# Patient Record
Sex: Female | Born: 1980
Health system: Southern US, Community
[De-identification: ages and names within clinical notes are randomized; demographics above are authoritative.]

## PROBLEM LIST (undated history)

## (undated) DIAGNOSIS — E039 Hypothyroidism, unspecified: Secondary | ICD-10-CM

## (undated) DIAGNOSIS — L309 Dermatitis, unspecified: Secondary | ICD-10-CM

## (undated) DIAGNOSIS — N281 Cyst of kidney, acquired: Secondary | ICD-10-CM

## (undated) DIAGNOSIS — J45909 Unspecified asthma, uncomplicated: Secondary | ICD-10-CM

## (undated) DIAGNOSIS — T7840XA Allergy, unspecified, initial encounter: Secondary | ICD-10-CM

## (undated) DIAGNOSIS — K589 Irritable bowel syndrome without diarrhea: Secondary | ICD-10-CM

## (undated) HISTORY — DX: Allergy, unspecified, initial encounter: T78.40XA

## (undated) HISTORY — DX: Cyst of kidney, acquired: N28.1

## (undated) HISTORY — DX: Dermatitis, unspecified: L30.9

## (undated) HISTORY — DX: Unspecified asthma, uncomplicated: J45.909

## (undated) HISTORY — DX: Hypothyroidism, unspecified: E03.9

## (undated) HISTORY — DX: Irritable bowel syndrome without diarrhea: K58.9

---

## 2003-04-20 HISTORY — PX: ROBOTICALLY ASSISTED LAPAROSCOPIC URETERAL RE-IMPLANTATION: SHX6481

## 2014-03-11 ENCOUNTER — Encounter: Payer: Self-pay | Admitting: Family

## 2014-03-11 ENCOUNTER — Ambulatory Visit (INDEPENDENT_AMBULATORY_CARE_PROVIDER_SITE_OTHER): Payer: BC Managed Care – PPO | Admitting: Family

## 2014-03-11 VITALS — BP 116/60 | HR 89 | Temp 98.6°F | Resp 18 | Ht 64.0 in | Wt 108.0 lb

## 2014-03-11 DIAGNOSIS — B351 Tinea unguium: Secondary | ICD-10-CM

## 2014-03-11 DIAGNOSIS — K219 Gastro-esophageal reflux disease without esophagitis: Secondary | ICD-10-CM

## 2014-03-11 DIAGNOSIS — N1370 Vesicoureteral-reflux, unspecified: Secondary | ICD-10-CM

## 2014-03-11 DIAGNOSIS — E039 Hypothyroidism, unspecified: Secondary | ICD-10-CM

## 2014-03-11 DIAGNOSIS — H579 Unspecified disorder of eye and adnexa: Secondary | ICD-10-CM

## 2014-03-11 DIAGNOSIS — J45909 Unspecified asthma, uncomplicated: Secondary | ICD-10-CM | POA: Insufficient documentation

## 2014-03-11 DIAGNOSIS — R938 Abnormal findings on diagnostic imaging of other specified body structures: Secondary | ICD-10-CM

## 2014-03-11 DIAGNOSIS — J4540 Moderate persistent asthma, uncomplicated: Secondary | ICD-10-CM

## 2014-03-11 DIAGNOSIS — K589 Irritable bowel syndrome without diarrhea: Secondary | ICD-10-CM

## 2014-03-11 MED ORDER — NITROFURANTOIN MACROCRYSTAL 50 MG PO CAPS: ORAL_CAPSULE | ORAL | Status: DC

## 2014-03-11 MED ORDER — LEVOTHYROXINE SODIUM 112 MCG PO TABS: 112.0000 ug | ORAL_TABLET | Freq: Every day | ORAL | Status: DC

## 2014-03-11 MED ORDER — FLUCONAZOLE 150 MG PO TABS: 150.0000 mg | ORAL_TABLET | ORAL | Status: DC

## 2014-03-11 MED ORDER — TRETINOIN 0.05 % EX CREA: TOPICAL_CREAM | Freq: Every day | CUTANEOUS | Status: DC

## 2014-03-11 MED ORDER — ALBUTEROL SULFATE HFA 108 (90 BASE) MCG/ACT IN AERS: 2.0000 | INHALATION_SPRAY | Freq: Four times a day (QID) | RESPIRATORY_TRACT | Status: AC | PRN

## 2014-03-11 MED ORDER — FLUTICASONE-SALMETEROL 250-50 MCG/DOSE IN AEPB: 1.0000 | INHALATION_SPRAY | Freq: Two times a day (BID) | RESPIRATORY_TRACT | Status: DC

## 2014-03-11 MED ORDER — LEVONORGEST-ETH ESTRAD 91-DAY 0.15-0.03 MG PO TABS: 1.0000 | ORAL_TABLET | Freq: Every day | ORAL | Status: DC

## 2014-03-11 NOTE — Patient Instructions (Signed)
Please complete lab work prior to leaving. You will be contacted about your kidney ultrasound. Start diflucan for toenails. Schedule complete physical in 3 months.

## 2014-03-11 NOTE — Assessment & Plan Note (Signed)
Stable on advair, and prn albuterol.

## 2014-03-11 NOTE — Progress Notes (Signed)
Subjective:    Patient ID: Tiffany Hamilton, female    DOB: 1981/01/10, 33 y.o.   MRN: [id number]  HPI  Ms. Tiffany Hamilton is a 33 yr old female who presents today to establish care. Moved from [place] in June.  Her husband moved here for job.    Asthma- has had since childhood.  Used to have weekly allergy shots as a child.  Notes allergies improved as she has gotten older.  Has tried flonase but nasonex.  Reports that she uses advair once daily.  Flares up if she is sick or if she stops taking x 1 week. She reports + pet allergies.  Uses otc alcon drops. She has dog (lab hound mix).  If she washes her hands after she pets the dog.  IBS- Diarrhea- has tried multiple meds without improvement. Saw GI in [place].  She tried anaspaz, levsin, and bentyl.   Hypothyroid- has been on current dose x 1 year.  Saw [facility] and was told that she had "bumps" in the inner lid and she advised the following labs:  CBC-Diff, Thyroid panel, TSh, Free t4, Thyroid antibody, T3, ESR, ANA, CMP.   GERD- reports that GI recommended EDG, but she did not complete.  Reports that she takes zantac.   Hx of ureteral reflux- since she was a baby- had surgery 2005 an was told that she had scarring on both kidneys and cyst on the left kidney.  Had a right ureter re-implantation for reflux of the bladder.    Toenail fungus- has tried otc topical rx without improvement.   Review of Systems    see HPI  Past Medical History  Diagnosis Date  . Cyst of left kidney   . Thyroid disease     hypothyroidism  . Asthma   . Allergy   . IBS (irritable bowel syndrome)   . Eczema     History   Social History  . Marital Status: Married    Spouse Name: N/A    Number of Children: N/A  . Years of Education: N/A   Occupational History  . Not on file.   Social History Main Topics  . Smoking status: Former Smoker -- 10 years  . Smokeless tobacco: Never Used     Comment: Previous tobacco use was occasional for 10 years.  .  Alcohol Use: 0.0 oz/week    0 Not specified per week     Comment: 1 beer and 1 glass of wine each night.  . Drug Use: Not on file  . Sexual Activity: Not on file   Other Topics Concern  . 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 Robotically assisted laparoscopic ureteral re-implantation Right 2005    Family History  Problem Relation Age of Onset  . Hypertension Mother   . Hypothyroidism Mother   . Hypothyroidism Father   . Asthma Father   . Cancer Maternal Uncle     pancreas  . Cancer Maternal Grandfather     throat  . Cancer Paternal Grandmother     lung  . Cancer Paternal Grandfather     throat    Allergies not on file  No current outpatient prescriptions on file prior to visit.   No current facility-administered medications on file prior  to visit.    BP 116/60 mmHg  Pulse 89  Temp(Src) 98.6 F (37 C) (Oral)  Resp 18  Ht 5' 4" (1.626 m)  Wt 108 lb (48.988 kg)  BMI 18.53 kg/m2  SpO2 100%  LMP 07/18/2013    Objective:   Physical Exam  Constitutional: She is oriented to person, place, and time. She appears well-developed and well-nourished. No distress.  HENT:  Head: Normocephalic and atraumatic.  Right Ear: Tympanic membrane and ear canal normal.  Left Ear: Tympanic membrane and ear canal normal.  Mouth/Throat: No posterior oropharyngeal edema or posterior oropharyngeal erythema.  Eyes: No scleral icterus.  Neck: No thyromegaly present.  Cardiovascular: Normal rate and regular rhythm.   No murmur heard. Pulmonary/Chest: Effort normal and breath sounds normal. No respiratory distress. She has no wheezes. She has no rales. She exhibits no tenderness.  Musculoskeletal: She exhibits no edema.  Neurological: She is alert and oriented to person, place, and time.  Skin:  Bilateral great  toenails have discoloration  Psychiatric: She has a normal mood and affect. Her behavior is normal. Judgment and thought content normal.          Assessment & Plan:

## 2014-03-11 NOTE — Progress Notes (Signed)
Pre visit review using our clinic review tool, if applicable. No additional management support is needed unless otherwise documented below in the visit note. 

## 2014-03-12 LAB — HEPATIC FUNCTION PANEL
ALT: 11 U/L (ref 0–35)
AST: 21 U/L (ref 0–37)
Albumin: 4 g/dL (ref 3.5–5.2)
Alkaline Phosphatase: 16 U/L — ABNORMAL LOW (ref 39–117)
BILIRUBIN DIRECT: 0.1 mg/dL (ref 0.0–0.3)
BILIRUBIN TOTAL: 0.6 mg/dL (ref 0.2–1.2)
TOTAL PROTEIN: 7.4 g/dL (ref 6.0–8.3)

## 2014-03-12 LAB — TSH: TSH: 3.47 u[IU]/mL (ref 0.35–4.50)

## 2014-03-12 LAB — SEDIMENTATION RATE: SED RATE: 7 mm/h (ref 0–22)

## 2014-03-12 LAB — T3, FREE: T3, Free: 2.7 pg/mL (ref 2.3–4.2)

## 2014-03-13 ENCOUNTER — Other Ambulatory Visit (HOSPITAL_BASED_OUTPATIENT_CLINIC_OR_DEPARTMENT_OTHER): Payer: BC Managed Care – PPO

## 2014-03-13 LAB — ANA: Anti Nuclear Antibody(ANA): NEGATIVE

## 2014-03-13 LAB — THYROID ANTIBODIES
Thyroglobulin Ab: 4 IU/mL — ABNORMAL HIGH (ref <2)
Thyroperoxidase Ab SerPl-aCnc: 201 IU/mL — ABNORMAL HIGH (ref <9)

## 2014-03-16 ENCOUNTER — Encounter: Payer: Self-pay | Admitting: Family

## 2014-03-16 DIAGNOSIS — N1370 Vesicoureteral-reflux, unspecified: Secondary | ICD-10-CM | POA: Insufficient documentation

## 2014-03-16 DIAGNOSIS — B351 Tinea unguium: Secondary | ICD-10-CM | POA: Insufficient documentation

## 2014-03-16 DIAGNOSIS — E039 Hypothyroidism, unspecified: Secondary | ICD-10-CM | POA: Insufficient documentation

## 2014-03-16 DIAGNOSIS — K219 Gastro-esophageal reflux disease without esophagitis: Secondary | ICD-10-CM | POA: Insufficient documentation

## 2014-03-16 DIAGNOSIS — K589 Irritable bowel syndrome without diarrhea: Secondary | ICD-10-CM | POA: Insufficient documentation

## 2014-03-16 NOTE — Assessment & Plan Note (Signed)
Will obtain bmet to assess renal function as well as a renal ultrasound to further evaluate.  If abnormal, plan referral to urology.

## 2014-03-16 NOTE — Assessment & Plan Note (Signed)
Will rx with lamisil.

## 2014-03-16 NOTE — Assessment & Plan Note (Signed)
Clinically stable on zantac.

## 2014-03-16 NOTE — Assessment & Plan Note (Signed)
Stable on synthroid, + thyroid Antibodies but not surprising given hx of hypothyroid.

## 2014-03-16 NOTE — Assessment & Plan Note (Signed)
Fair control. We discussed referral to local GI. She declines at this time.

## 2014-03-19 ENCOUNTER — Encounter: Payer: Self-pay | Admitting: Family

## 2014-03-19 ENCOUNTER — Telehealth: Payer: Self-pay | Admitting: *Deleted

## 2014-03-19 ENCOUNTER — Telehealth: Payer: Self-pay | Admitting: Family

## 2014-03-19 DIAGNOSIS — Z0000 Encounter for general adult medical examination without abnormal findings: Secondary | ICD-10-CM

## 2014-03-19 DIAGNOSIS — R61 Generalized hyperhidrosis: Secondary | ICD-10-CM

## 2014-03-19 NOTE — Telephone Encounter (Signed)
Pt stated she would continue to use my chart and send Melissa a message

## 2014-03-19 NOTE — Telephone Encounter (Signed)
Lab is unable to add below tests.  Pt's next f/u is 06/17/14. Do we need to bring pt back before that time to obtain below labs?

## 2014-03-19 NOTE — Telephone Encounter (Signed)
-----   Message from Sandford CrazeMelissa O'Sullivan, NP sent at 03/16/2014 10:12 PM EST ----- BMET and CBC were not drawn- could you please see if lab can add on? thanks

## 2014-03-19 NOTE — Telephone Encounter (Signed)
Attempted to reach pt and left message to return my call or send another mychart message.

## 2014-03-19 NOTE — Telephone Encounter (Signed)
Caller name:Trebilcock, Lyle Relation to ZO:XWRUpt:self Call back number:[phone] Pharmacy:  Reason for call: pt states she has been communicating with Melissa through my chart and states she has a question regarding her labs.

## 2014-03-20 NOTE — Telephone Encounter (Signed)
Lets request that she return sooner to complete please.

## 2014-03-20 NOTE — Telephone Encounter (Signed)
See previous phone note of 03/19/14.

## 2014-03-20 NOTE — Telephone Encounter (Signed)
Spoke with pt. R/S cpx for 04/03/14 at 8am. Future lab orders entered 03/20/14 and will also need to complete cbc and bmet from 03/11/14 as well. Gave copy of phone note to Angie in the lab.

## 2014-03-20 NOTE — Telephone Encounter (Signed)
Spoke with pt.  She reports + night sweats.  Will have her return to lab for hiv screening and TB screening.  Please contact pt to arrange lab visit. She would like to schedule a cpx as well.

## 2014-03-30 ENCOUNTER — Ambulatory Visit (HOSPITAL_BASED_OUTPATIENT_CLINIC_OR_DEPARTMENT_OTHER)
Admission: RE | Admit: 2014-03-30 | Discharge: 2014-03-30 | Disposition: A | Discharge status: Home / Self Care | Payer: BC Managed Care – PPO | Source: Ambulatory Visit | Attending: Family | Admitting: Family

## 2014-03-30 DIAGNOSIS — N1330 Unspecified hydronephrosis: Secondary | ICD-10-CM | POA: Insufficient documentation

## 2014-03-30 DIAGNOSIS — N1370 Vesicoureteral-reflux, unspecified: Secondary | ICD-10-CM

## 2014-03-30 DIAGNOSIS — Q625 Duplication of ureter: Secondary | ICD-10-CM | POA: Insufficient documentation

## 2014-03-31 ENCOUNTER — Encounter: Payer: Self-pay | Admitting: Family

## 2014-03-31 DIAGNOSIS — N281 Cyst of kidney, acquired: Secondary | ICD-10-CM | POA: Insufficient documentation

## 2014-03-31 DIAGNOSIS — Q625 Duplication of ureter: Secondary | ICD-10-CM | POA: Insufficient documentation

## 2014-04-01 ENCOUNTER — Telehealth: Payer: Self-pay | Admitting: *Deleted

## 2014-04-01 DIAGNOSIS — N281 Cyst of kidney, acquired: Secondary | ICD-10-CM

## 2014-04-01 NOTE — Telephone Encounter (Signed)
Left detailed message on cell# re: below result and to call to confirm f/u u/s and if any questions.  Notes Recorded by Sandford CrazeMelissa O'Sullivan, NP on 03/31/2014 at 12:44 PM Please let pt know that US looks stable. Note is made of renal cyst. Lets plan to repeat US in 6 months.

## 2014-04-03 ENCOUNTER — Ambulatory Visit (INDEPENDENT_AMBULATORY_CARE_PROVIDER_SITE_OTHER): Payer: BC Managed Care – PPO | Admitting: Family

## 2014-04-03 ENCOUNTER — Encounter: Payer: Self-pay | Admitting: Family

## 2014-04-03 ENCOUNTER — Other Ambulatory Visit (HOSPITAL_COMMUNITY)
Admission: RE | Admit: 2014-04-03 | Discharge: 2014-04-03 | Disposition: A | Discharge status: Home / Self Care | Payer: BC Managed Care – PPO | Source: Ambulatory Visit | Attending: Family | Admitting: Family

## 2014-04-03 VITALS — BP 96/68 | HR 80 | Temp 99.0°F | Resp 16 | Ht 64.0 in | Wt 107.2 lb

## 2014-04-03 DIAGNOSIS — N760 Acute vaginitis: Secondary | ICD-10-CM | POA: Diagnosis present

## 2014-04-03 DIAGNOSIS — Z113 Encounter for screening for infections with a predominantly sexual mode of transmission: Secondary | ICD-10-CM | POA: Insufficient documentation

## 2014-04-03 DIAGNOSIS — R61 Generalized hyperhidrosis: Secondary | ICD-10-CM

## 2014-04-03 DIAGNOSIS — Z23 Encounter for immunization: Secondary | ICD-10-CM

## 2014-04-03 DIAGNOSIS — Z0000 Encounter for general adult medical examination without abnormal findings: Secondary | ICD-10-CM

## 2014-04-03 DIAGNOSIS — Z01419 Encounter for gynecological examination (general) (routine) without abnormal findings: Secondary | ICD-10-CM

## 2014-04-03 LAB — LIPID PANEL
CHOL/HDL RATIO: 3
Cholesterol: 180 mg/dL (ref 0–200)
HDL: 63.4 mg/dL (ref >39.00)
LDL Cholesterol: 97 mg/dL (ref 0–99)
NonHDL: 116.6
TRIGLYCERIDES: 99 mg/dL (ref 0.0–149.0)
VLDL: 19.8 mg/dL (ref 0.0–40.0)

## 2014-04-03 LAB — BASIC METABOLIC PANEL
BUN: 9 mg/dL (ref 6–23)
CO2: 25 mEq/L (ref 19–32)
Calcium: 9.2 mg/dL (ref 8.4–10.5)
Chloride: 106 mEq/L (ref 96–112)
Creatinine, Ser: 0.8 mg/dL (ref 0.4–1.2)
GFR: 85.56 mL/min (ref >60.00)
Glucose, Bld: 61 mg/dL — ABNORMAL LOW (ref 70–99)
Potassium: 4.1 mEq/L (ref 3.5–5.1)
SODIUM: 135 meq/L (ref 135–145)

## 2014-04-03 LAB — CBC WITH DIFFERENTIAL/PLATELET
BASOS ABS: 0 10*3/uL (ref 0.0–0.1)
BASOS PCT: 0.6 % (ref 0.0–3.0)
Eosinophils Absolute: 0.2 10*3/uL (ref 0.0–0.7)
Eosinophils Relative: 3.8 % (ref 0.0–5.0)
HCT: 43.8 % (ref 36.0–46.0)
HEMOGLOBIN: 14.4 g/dL (ref 12.0–15.0)
LYMPHS PCT: 26.4 % (ref 12.0–46.0)
Lymphs Abs: 1.6 10*3/uL (ref 0.7–4.0)
MCHC: 32.8 g/dL (ref 30.0–36.0)
MCV: 93.3 fl (ref 78.0–100.0)
Monocytes Absolute: 0.4 10*3/uL (ref 0.1–1.0)
Monocytes Relative: 6.7 % (ref 3.0–12.0)
NEUTROS ABS: 3.7 10*3/uL (ref 1.4–7.7)
Neutrophils Relative %: 62.5 % (ref 43.0–77.0)
Platelets: 267 10*3/uL (ref 150.0–400.0)
RBC: 4.69 Mil/uL (ref 3.87–5.11)
RDW: 13 % (ref 11.5–15.5)
WBC: 5.9 10*3/uL (ref 4.0–10.5)

## 2014-04-03 NOTE — Assessment & Plan Note (Signed)
Continue healthy diet, exercise. Tdap today, complete fasting lab work today.  Will add HIV, TB gold due to complaint of night sweats.  GC/Chlamydia and wet prep performed today at pt's request.

## 2014-04-03 NOTE — Progress Notes (Signed)
Pre visit review using our clinic review tool, if applicable. No additional management support is needed unless otherwise documented below in the visit note. 

## 2014-04-03 NOTE — Addendum Note (Signed)
Addended by: Eustace QuailEABOLD, Kadee Philyaw J on: 04/03/2014 04:07 PM   Modules accepted: Orders

## 2014-04-03 NOTE — Addendum Note (Signed)
Addended by: Arnette NorrisPAYNE, Hoover Grewe P on: 04/03/2014 10:32 AM   Modules accepted: Orders

## 2014-04-03 NOTE — Patient Instructions (Addendum)
Please complete lab work prior to leaving. We will contact you with your results.  Follow up in 6 months.

## 2014-04-03 NOTE — Telephone Encounter (Signed)
Melissa--do you want to enter future u/s order?

## 2014-04-03 NOTE — Progress Notes (Signed)
Subjective:    Patient ID: Tiffany Hamilton, female    DOB: [birth year], 33 y.o.   MRN: [id number]  HPI  Ms. Tiffany Hamilton is a 33 yr old female who presents today for cpx. Patient presents today for complete physical.  Immunizations: believes she is past due for tetanus, flu shot is normal. Diet:  Healthy for the most part Exercise: walks frequently Pap Smear:5/15- per gyn- pt reports normal.   Reports vaginal dryness.  Dental: up to date Eye:  Up to date  Review of Systems  Constitutional: Negative for unexpected weight change.  HENT: Negative for hearing loss and rhinorrhea.   Eyes: Negative for visual disturbance.  Respiratory: Negative for cough and shortness of breath.   Cardiovascular: Negative for chest pain.  Gastrointestinal: Negative for nausea.       Gerd symptoms with stress,takes zantac as needed. Diarrhea IBS  Genitourinary: Negative for menstrual problem.  Musculoskeletal: Negative for myalgias and arthralgias.  Skin:       Eczema on hands in the winter- worse with stress  Neurological:       Occasional headaches  Hematological: Negative for adenopathy.  Psychiatric/Behavioral:       Denies anxiety/depression  GYN: reports + vaginal dryness. Also reports some night sweats.      Past Medical History  Diagnosis Date  . Cyst of left kidney   . Thyroid disease     hypothyroidism  . Asthma   . Allergy   . IBS (irritable bowel syndrome)   . Eczema     History   Social History  . Marital Status: Married    Spouse Name: N/A    Number of Children: N/A  . Years of Education: N/A   Occupational History  . Not on file.   Social History Main Topics  . Smoking status: Former Smoker -- 10 years  . Smokeless tobacco: Never Used     Comment: Previous tobacco use was occasional for 10 years.  . Alcohol Use: 0.0 oz/week    0 Not specified per week     Comment: 1 beer and 1 glass of wine each night.  . Drug Use: Not on file  . Sexual Activity: Not on file    Other Topics Concern  . Not on file   Social History Narrative   Engineering geologistenior admin assistant   Bachelors degree   3 step children   1 dog   1 cat   Enjoys shopping, cleaning      Past Surgical History  Procedure Laterality Date  . Robotically assisted laparoscopic ureteral re-implantation Right 2005    Family History  Problem Relation Age of Onset  . Hypertension Mother   . Hypothyroidism Mother   . Hypothyroidism Father   . Asthma Father   . Cancer Maternal Uncle     pancreas  . Cancer Maternal Grandfather     throat  . Cancer Paternal Grandmother     lung  . Cancer Paternal Grandfather     throat    Allergies  Allergen Reactions  . Morphine And Related Hives  . Penicillins Hives  . Septra [Sulfamethoxazole-Trimethoprim] Hives    Current Outpatient Prescriptions on File Prior to Visit  Medication Sig Dispense Refill  . albuterol (PROVENTIL HFA;VENTOLIN HFA) 108 (90 BASE) MCG/ACT inhaler Inhale 2 puffs into the lungs every 6 (six) hours as needed for wheezing or shortness of breath. 1 Inhaler 2  . fluconazole (DIFLUCAN) 150 MG tablet Take 1 tablet (150 mg total) by mouth once  a week. 12 tablet 0  . Fluticasone-Salmeterol (ADVAIR DISKUS) 250-50 MCG/DOSE AEPB Inhale 1 puff into the lungs 2 (two) times daily. 1 each 3  . levonorgestrel-ethinyl estradiol (SEASONALE) 0.15-0.03 MG tablet Take 1 tablet by mouth daily. 1 Package 4  . levothyroxine (SYNTHROID, LEVOTHROID) 112 MCG tablet Take 1 tablet (112 mcg total) by mouth daily. 90 tablet 3  . nitrofurantoin (MACRODANTIN) 50 MG capsule One tab by mouth after intercourse 30 capsule 2  . ranitidine (ZANTAC) 150 MG tablet Take 150 mg by mouth at bedtime.    . tretinoin (RETIN-A) 0.05 % cream Apply topically at bedtime. 45 g 0   No current facility-administered medications on file prior to visit.    BP 96/68 mmHg  Pulse 80  Temp(Src) 99 F (37.2 C) (Oral)  Resp 16  Ht 5\' 4"  (1.626 m)  Wt 107 lb 3.2 oz (48.626 kg)   BMI 18.39 kg/m2  SpO2 99%  LMP 07/18/2013    Objective:   Physical Exam  Physical Exam  Constitutional: She is oriented to person, place, and time. She appears well-developed and well-nourished. No distress.  HENT:  Head: Normocephalic and atraumatic.  Right Ear: Tympanic membrane and ear canal normal.  Left Ear: Tympanic membrane and ear canal normal.  Mouth/Throat: Oropharynx is clear and moist.  Eyes: Pupils are equal, round, and reactive to light. No scleral icterus.  Neck: Normal range of motion. No thyromegaly present.  Cardiovascular: Normal rate and regular rhythm.   No murmur heard. Pulmonary/Chest: Effort normal and breath sounds normal. No respiratory distress. He has no wheezes. She has no rales. She exhibits no tenderness.  Abdominal: Soft. Bowel sounds are normal. He exhibits no distension and no mass. There is no tenderness. There is no rebound and no guarding.  Musculoskeletal: She exhibits no edema.  Lymphadenopathy:    She has no cervical adenopathy.  Neurological: She is alert and oriented to person, place, and time. She has 3+ bilateral patellar reflexes. She exhibits normal muscle tone. Coordination normal.  Skin: Skin is warm and dry.  Psychiatric: She has a normal mood and affect. Her behavior is normal. Judgment and thought content normal.  Breasts: Examined lying Right: Without masses, retractions, discharge or axillary adenopathy.  Left: Without masses, retractions, discharge or axillary adenopathy.  Inguinal/mons: Normal without inguinal adenopathy  External genitalia: Normal  BUS/Urethra/Skene's glands: Normal  Bladder: Normal  Vagina: Normal  Cervix: Normal  Anus and perineum: Normal           Assessment & Plan:         Assessment & Plan:

## 2014-04-03 NOTE — Telephone Encounter (Signed)
Yes please

## 2014-04-03 NOTE — Addendum Note (Signed)
Addended by: Mervin KungFERGERSON, Ryne Mctigue A on: 04/03/2014 09:14 AM   Modules accepted: Orders

## 2014-04-03 NOTE — Telephone Encounter (Signed)
Order placed

## 2014-04-04 ENCOUNTER — Encounter: Payer: Self-pay | Admitting: Family

## 2014-04-04 LAB — HIV ANTIBODY (ROUTINE TESTING W REFLEX): HIV: NONREACTIVE

## 2014-04-04 LAB — CERVICOVAGINAL ANCILLARY ONLY
Chlamydia: NEGATIVE
Neisseria Gonorrhea: NEGATIVE
Wet Prep (BD Affirm): NEGATIVE
Wet Prep (BD Affirm): NEGATIVE
Wet Prep (BD Affirm): NEGATIVE

## 2014-04-04 NOTE — Addendum Note (Signed)
Addended by: Silvio PateHOMPSON, Milan Perkins D on: 04/04/2014 05:04 PM   Modules accepted: Orders

## 2014-04-05 ENCOUNTER — Telehealth: Payer: Self-pay | Admitting: *Deleted

## 2014-04-05 LAB — QUANTIFERON TB GOLD ASSAY (BLOOD)
INTERFERON GAMMA RELEASE ASSAY: NEGATIVE
Mitogen value: 9.07 IU/mL
QUANTIFERON TB AG MINUS NIL: 0 [IU]/mL
Quantiferon Nil Value: 0.03 IU/mL
TB AG VALUE: 0.03 [IU]/mL

## 2014-04-05 LAB — MICROALBUMIN / CREATININE URINE RATIO
Creatinine,U: 99.1 mg/dL
MICROALB/CREAT RATIO: 1.1 mg/g (ref 0.0–30.0)
Microalb, Ur: 1.1 mg/dL (ref 0.0–1.9)

## 2014-04-05 MED ORDER — LEVONORGEST-ETH ESTRAD 91-DAY 0.15-0.03 MG PO TABS: 1.0000 | ORAL_TABLET | Freq: Every day | ORAL | Status: DC

## 2014-04-05 MED ORDER — LEVOTHYROXINE SODIUM 112 MCG PO TABS: 112.0000 ug | ORAL_TABLET | Freq: Every day | ORAL | Status: DC

## 2014-04-05 NOTE — Telephone Encounter (Signed)
Received fax request from [facility] for levothyroxine and L-norges/eth es(jolessa).  Refills sent.

## 2014-04-23 ENCOUNTER — Telehealth: Payer: Self-pay | Admitting: *Deleted

## 2014-04-23 NOTE — Telephone Encounter (Signed)
Received fax from [facility] stating Advair is not covered by insurance. Preferred alternatives are:  Dulera, symbicort, pulmicort and QVAR. Please advise if one of these would be appropriate?

## 2014-04-24 MED ORDER — BUDESONIDE-FORMOTEROL FUMARATE 80-4.5 MCG/ACT IN AERO: 2.0000 | INHALATION_SPRAY | Freq: Two times a day (BID) | RESPIRATORY_TRACT | Status: DC

## 2014-04-24 NOTE — Telephone Encounter (Signed)
D/c advair, start symbicort (pended below).  Please notify pt.

## 2014-04-24 NOTE — Telephone Encounter (Signed)
Notified pt and she voices understanding. Symbicort rx sent to [facility].

## 2014-05-17 ENCOUNTER — Encounter: Payer: Self-pay | Admitting: Family

## 2014-05-17 MED ORDER — MOMETASONE FUROATE 50 MCG/ACT NA SUSP: 2.0000 | Freq: Every day | NASAL | Status: DC

## 2014-05-17 NOTE — Telephone Encounter (Signed)
Please let pt know nasonex is otc. If she prefers that we send rx to pharmacy ok to send 3 month supply.

## 2014-05-27 ENCOUNTER — Telehealth: Payer: Self-pay | Admitting: Family

## 2014-05-27 MED ORDER — FLUNISOLIDE 25 MCG/ACT (0.025%) NA SOLN: 2.0000 | Freq: Every day | NASAL | Status: DC

## 2014-05-27 NOTE — Telephone Encounter (Signed)
[facility], nasonex non-formulary, preferred alternative is flunisolide ns spray 0.025%, new Rx faxed for preferred alternative.

## 2014-05-28 ENCOUNTER — Encounter: Payer: Self-pay | Admitting: Family

## 2014-05-28 MED ORDER — NITROFURANTOIN MACROCRYSTAL 50 MG PO CAPS: ORAL_CAPSULE | ORAL | Status: DC

## 2014-06-06 ENCOUNTER — Other Ambulatory Visit: Payer: Self-pay | Admitting: Family

## 2014-06-06 DIAGNOSIS — Z5181 Encounter for therapeutic drug level monitoring: Secondary | ICD-10-CM

## 2014-06-07 MED ORDER — FLUCONAZOLE 150 MG PO TABS: 150.0000 mg | ORAL_TABLET | ORAL | Status: DC

## 2014-06-07 NOTE — Addendum Note (Signed)
Addended by: Sandford Craze'SULLIVAN, Samika Vetsch on: 06/07/2014 08:31 AM   Modules accepted: Orders

## 2014-06-10 ENCOUNTER — Telehealth: Payer: Self-pay | Admitting: Family

## 2014-06-10 DIAGNOSIS — Z5181 Encounter for therapeutic drug level monitoring: Secondary | ICD-10-CM

## 2014-06-10 NOTE — Telephone Encounter (Signed)
Pt decided to go with [facility] as she has been there before and is familiar with that area. Future order placed. See phone note.

## 2014-06-10 NOTE — Telephone Encounter (Signed)
When spoke with PT earlier I mentioned the [facility] and she was ok with that option.

## 2014-06-10 NOTE — Telephone Encounter (Signed)
Left message for pt to return my call. [facility] or is there another lab that she prefers?

## 2014-06-10 NOTE — Telephone Encounter (Signed)
Spoke with pt and confirmed [facility]. Future order placed.

## 2014-06-10 NOTE — Telephone Encounter (Signed)
Caller name:Gailene Heavin Relationship to patient:self Can be [phone]   Reason for call: PT requesting [place] functions- Please advise.

## 2014-06-12 ENCOUNTER — Other Ambulatory Visit (INDEPENDENT_AMBULATORY_CARE_PROVIDER_SITE_OTHER): Payer: Self-pay

## 2014-06-12 DIAGNOSIS — Z5181 Encounter for therapeutic drug level monitoring: Secondary | ICD-10-CM

## 2014-06-12 LAB — HEPATIC FUNCTION PANEL
ALK PHOS: 16 U/L — AB (ref 39–117)
ALT: 12 U/L (ref 0–35)
AST: 15 U/L (ref 0–37)
Albumin: 3.9 g/dL (ref 3.5–5.2)
BILIRUBIN DIRECT: 0.1 mg/dL (ref 0.0–0.3)
TOTAL PROTEIN: 7 g/dL (ref 6.0–8.3)
Total Bilirubin: 0.7 mg/dL (ref 0.2–1.2)

## 2014-06-17 ENCOUNTER — Encounter: Payer: BC Managed Care – PPO | Admitting: Family

## 2014-07-02 ENCOUNTER — Other Ambulatory Visit: Payer: Self-pay | Admitting: Family

## 2014-07-03 NOTE — Telephone Encounter (Signed)
We were treating her for her toenails.  We should see her back in the office before we extend treatment.

## 2014-07-03 NOTE — Telephone Encounter (Signed)
Notified pt. She states she completed liver function testing 06/12/14 and wants to know if it is still necessary to schedule office visit?  Last rx we gave was 05/28/14, #6 but pt states pharmacy only gave her #4 tablets.  Please advise.

## 2014-07-05 NOTE — Telephone Encounter (Signed)
I sent a 12 week supply on 11/23, generally we only treat for 12 weeks so I would like to see her back in the office to re-evaluated her toenails. Alternatively, I can arrange derm consult instead if she prefers. Would hold off on refills.

## 2014-07-05 NOTE — Telephone Encounter (Signed)
Left detailed message on voicemail re: below information and for pt to call us back and let us know how she wants to proceed.

## 2014-07-08 ENCOUNTER — Telehealth: Payer: Self-pay | Admitting: *Deleted

## 2014-07-08 MED ORDER — NITROFURANTOIN MACROCRYSTAL 50 MG PO CAPS: ORAL_CAPSULE | ORAL | Status: DC

## 2014-07-08 NOTE — Telephone Encounter (Signed)
Received refill request from [facility] for nitrofurantoin. Rx sent.

## 2014-08-18 ENCOUNTER — Other Ambulatory Visit: Payer: Self-pay | Admitting: Family

## 2014-08-25 ENCOUNTER — Telehealth: Payer: Self-pay | Admitting: Family

## 2014-08-25 NOTE — Telephone Encounter (Signed)
-----   Message from Eugene Garnetarolyn S Chandler sent at 08/22/2014 10:30 AM EDT ----- Regarding: renal us Daphine DeutscherHey Jenn:  I called [facility] to schedule renal ultrasound and she stated she did not want to have this renal ultrasound for another 5 years.  Said that the cyst had not changed in 10 years when she had the last one in December.  Thanks, Eber Jonesarolyn

## 2014-09-23 ENCOUNTER — Encounter: Payer: Self-pay | Admitting: Family

## 2014-09-24 NOTE — Telephone Encounter (Signed)
Notified pt and she voices understanding. May be able to bring pt in tomorrow at 8:45am. Advised pt I will confirm with her this afternoon via mychart.

## 2014-09-24 NOTE — Telephone Encounter (Signed)
I would like to evaluate in office. Sometimes these infections are bacterial not yeast, so I like to check a vaginal swab.

## 2014-09-24 NOTE — Telephone Encounter (Signed)
Spoke with pt and scheduled appt for tomorrow at 10am.

## 2014-09-25 ENCOUNTER — Ambulatory Visit (INDEPENDENT_AMBULATORY_CARE_PROVIDER_SITE_OTHER): Payer: BLUE CROSS/BLUE SHIELD | Admitting: Family

## 2014-09-25 ENCOUNTER — Other Ambulatory Visit (HOSPITAL_COMMUNITY)
Admission: RE | Admit: 2014-09-25 | Discharge: 2014-09-25 | Disposition: A | Discharge status: Home / Self Care | Payer: BLUE CROSS/BLUE SHIELD | Source: Ambulatory Visit | Attending: Family | Admitting: Family

## 2014-09-25 ENCOUNTER — Encounter: Payer: Self-pay | Admitting: Family

## 2014-09-25 ENCOUNTER — Telehealth: Payer: Self-pay | Admitting: *Deleted

## 2014-09-25 VITALS — BP 98/68 | HR 75 | Temp 98.2°F | Resp 16 | Ht 64.0 in | Wt 108.8 lb

## 2014-09-25 DIAGNOSIS — N760 Acute vaginitis: Secondary | ICD-10-CM | POA: Insufficient documentation

## 2014-09-25 DIAGNOSIS — E039 Hypothyroidism, unspecified: Secondary | ICD-10-CM | POA: Diagnosis not present

## 2014-09-25 DIAGNOSIS — J309 Allergic rhinitis, unspecified: Secondary | ICD-10-CM

## 2014-09-25 DIAGNOSIS — B351 Tinea unguium: Secondary | ICD-10-CM

## 2014-09-25 LAB — TSH: TSH: 1.07 u[IU]/mL (ref 0.35–4.50)

## 2014-09-25 MED ORDER — FLUTICASONE PROPIONATE 50 MCG/ACT NA SUSP: 2.0000 | Freq: Every day | NASAL | Status: DC

## 2014-09-25 MED ORDER — FLUCONAZOLE 150 MG PO TABS: ORAL_TABLET | ORAL | Status: DC

## 2014-09-25 NOTE — Assessment & Plan Note (Addendum)
clinically stable on current dose of synthroid. Continue same, obtain bmet.

## 2014-09-25 NOTE — Progress Notes (Signed)
Pre visit review using our clinic review tool, if applicable. No additional management support is needed unless otherwise documented below in the visit note. 

## 2014-09-25 NOTE — Progress Notes (Signed)
Subjective:    Patient ID: Tiffany Hamilton, female    DOB: [birth year], 34 y.o.   MRN: [id number]  HPI  Tayonna presents today with chief complaint of vaginal itching. Pt reports that she tried otc monistat 1 last week without improvement in her symptoms.  Hypothyroid- Reports feeling well on current dose of synthroid.  Lab Results  Component Value Date   TSH 3.47 03/11/2014   Allergic rhinitis- Reports nasalide causes her nose to burn.  Would like to restart flonase instead.   Review of Systems    see HPI  Past Medical History  Diagnosis Date  . Cyst of left kidney   . Thyroid disease     hypothyroidism  . Asthma   . Allergy   . IBS (irritable bowel syndrome)   . Eczema     History   Social History  . Marital Status: Married    Spouse Name: N/A  . Number of Children: N/A  . Years of Education: N/A   Occupational History  . Not on file.   Social History Main Topics  . Smoking status: Former Smoker -- 10 years  . Smokeless tobacco: Never Used     Comment: Previous tobacco use was occasional for 10 years.  . Alcohol Use: 0.0 oz/week    0 Standard drinks or equivalent per week     Comment: 1 beer and 1 glass of wine each night.  . Drug Use: Not on file  . Sexual Activity: Not on file   Other Topics Concern  . 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 Robotically assisted laparoscopic ureteral re-implantation Right 2005    Family History  Problem Relation Age of Onset  . Hypertension Mother   . Hypothyroidism Mother   . Hypothyroidism Father   . Asthma Father   . Cancer Maternal Uncle     pancreas  . Cancer Maternal Grandfather     throat  . Cancer Paternal Grandmother     lung  . Cancer Paternal Grandfather     throat    Allergies  Allergen Reactions  . Morphine And Related Hives   . Penicillins Hives  . Septra [Sulfamethoxazole-Trimethoprim] Hives    Current Outpatient Prescriptions on File Prior to Visit  Medication Sig Dispense Refill  . albuterol (PROVENTIL HFA;VENTOLIN HFA) 108 (90 BASE) MCG/ACT inhaler Inhale 2 puffs into the lungs every 6 (six) hours as needed for wheezing or shortness of breath. 1 Inhaler 2  . budesonide-formoterol (SYMBICORT) 80-4.5 MCG/ACT inhaler Inhale 2 puffs into the lungs 2 (two) times daily. 3 Inhaler 1  . levonorgestrel-ethinyl estradiol (SEASONALE) 0.15-0.03 MG tablet Take 1 tablet by mouth daily. 1 Package 1  . levothyroxine (SYNTHROID, LEVOTHROID) 112 MCG tablet TAKE 1 TABLET DAILY 90 tablet 0  . nitrofurantoin (MACRODANTIN) 50 MG capsule One tab by mouth after intercourse 90 capsule 1  . tretinoin (RETIN-A) 0.05 % cream Apply topically at bedtime. 45 g 0   No current facility-administered medications on file prior to visit.    BP 98/68 mmHg  Pulse 75  Temp(Src) 98.2 F (36.8 C) (Oral)  Resp 16  Ht  (1.626 m)  Wt 108 lb 12.8 oz (49.351 kg)  BMI 18.67 kg/m2  SpO2 97%  LMP 07/17/2014  Objective:   Physical Exam  Constitutional: She appears well-developed and well-nourished.  Neck: No thyromegaly present.  Cardiovascular: Normal rate, regular rhythm and normal heart sounds.   No murmur heard. Pulmonary/Chest: Effort normal and breath sounds normal. No respiratory distress. She has no wheezes.  Genitourinary: There is no rash on the right labia. There is no rash on the left labia. No vaginal discharge found.  Lymphadenopathy:    She has no cervical adenopathy.  Psychiatric: She has a normal mood and affect. Her behavior is normal. Judgment and thought content normal.          Assessment & Plan:

## 2014-09-25 NOTE — Assessment & Plan Note (Signed)
D/c nasalide, start flonase.

## 2014-09-25 NOTE — Addendum Note (Signed)
Addended by: Mervin KungFERGERSON, Jacey Eckerson A on: 09/25/2014 11:57 AM   Modules accepted: Orders

## 2014-09-25 NOTE — Assessment & Plan Note (Signed)
Will send rx for empiric diflucan.  Wet prep to be sent for confirmation.

## 2014-09-25 NOTE — Assessment & Plan Note (Signed)
Reports resolved.  Monitor.

## 2014-09-25 NOTE — Telephone Encounter (Signed)
Pt originally requested flonase to be sent to [facility] then requested [facility]. Rx cancelled at [facility] per Huntley DecSara.

## 2014-09-25 NOTE — Patient Instructions (Signed)
Please complete lab work prior to leaving Follow up for your annual physical.

## 2014-09-26 ENCOUNTER — Encounter: Payer: Self-pay | Admitting: Family

## 2014-09-26 LAB — CERVICOVAGINAL ANCILLARY ONLY: Wet Prep (BD Affirm): NEGATIVE

## 2014-10-24 ENCOUNTER — Telehealth: Payer: Self-pay | Admitting: Family

## 2014-10-24 ENCOUNTER — Other Ambulatory Visit: Payer: Self-pay | Admitting: Family

## 2014-10-24 DIAGNOSIS — N281 Cyst of kidney, acquired: Secondary | ICD-10-CM

## 2014-10-24 NOTE — Telephone Encounter (Signed)
See mychart.  

## 2014-10-25 NOTE — Addendum Note (Signed)
Addended by: Sandford Craze'SULLIVAN, Kianah Harries on: 10/25/2014 09:48 AM   Modules accepted: Orders

## 2014-12-03 ENCOUNTER — Telehealth: Payer: Self-pay | Admitting: *Deleted

## 2014-12-03 MED ORDER — BECLOMETHASONE DIPROPIONATE 80 MCG/ACT IN AERS: 1.0000 | INHALATION_SPRAY | Freq: Two times a day (BID) | RESPIRATORY_TRACT | Status: DC

## 2014-12-03 NOTE — Telephone Encounter (Signed)
I sent rx for qvar.  Pt should start this instead.  Let me know if this does not control her asthma.

## 2014-12-03 NOTE — Telephone Encounter (Signed)
Received fax from [facility] stating Symbicort inhaler is not covered under [facility]. Covered alternatives are: pulmicort flexhaler or QVAR 8.7gm inhaler.  Please advise if one of these is appropriate alternative?

## 2014-12-03 NOTE — Telephone Encounter (Signed)
Spoke with pt and she verbalized understanding.

## 2014-12-24 ENCOUNTER — Other Ambulatory Visit: Payer: Self-pay | Admitting: Family

## 2014-12-24 ENCOUNTER — Ambulatory Visit (INDEPENDENT_AMBULATORY_CARE_PROVIDER_SITE_OTHER): Payer: BLUE CROSS/BLUE SHIELD | Admitting: Family Medicine

## 2014-12-24 ENCOUNTER — Encounter: Payer: Self-pay | Admitting: Family Medicine

## 2014-12-24 VITALS — BP 100/68 | HR 95 | Temp 99.2°F | Wt 108.8 lb

## 2014-12-24 DIAGNOSIS — K589 Irritable bowel syndrome without diarrhea: Secondary | ICD-10-CM | POA: Diagnosis not present

## 2014-12-24 DIAGNOSIS — R197 Diarrhea, unspecified: Secondary | ICD-10-CM

## 2014-12-24 DIAGNOSIS — R112 Nausea with vomiting, unspecified: Secondary | ICD-10-CM

## 2014-12-24 LAB — COMPREHENSIVE METABOLIC PANEL
ALT: 10 U/L (ref 0–35)
AST: 14 U/L (ref 0–37)
Albumin: 3.8 g/dL (ref 3.5–5.2)
Alkaline Phosphatase: 15 U/L — ABNORMAL LOW (ref 39–117)
BILIRUBIN TOTAL: 0.7 mg/dL (ref 0.2–1.2)
BUN: 11 mg/dL (ref 6–23)
CO2: 30 mEq/L (ref 19–32)
Calcium: 9.2 mg/dL (ref 8.4–10.5)
Chloride: 103 mEq/L (ref 96–112)
Creatinine, Ser: 0.94 mg/dL (ref 0.40–1.20)
GFR: 72.76 mL/min (ref >60.00)
GLUCOSE: 88 mg/dL (ref 70–99)
Potassium: 4.6 mEq/L (ref 3.5–5.1)
Sodium: 138 mEq/L (ref 135–145)
Total Protein: 6.8 g/dL (ref 6.0–8.3)

## 2014-12-24 LAB — POCT URINALYSIS DIPSTICK
Bilirubin, UA: NEGATIVE
Blood, UA: NEGATIVE
Glucose, UA: NEGATIVE
Ketones, UA: NEGATIVE
LEUKOCYTES UA: NEGATIVE
Nitrite, UA: NEGATIVE
PH UA: 6
PROTEIN UA: NEGATIVE
SPEC GRAV UA: 1.01
Urobilinogen, UA: 0.2

## 2014-12-24 LAB — CBC
HCT: 41 % (ref 36.0–46.0)
Hemoglobin: 13.7 g/dL (ref 12.0–15.0)
MCHC: 33.4 g/dL (ref 30.0–36.0)
MCV: 92.1 fl (ref 78.0–100.0)
PLATELETS: 257 10*3/uL (ref 150.0–400.0)
RBC: 4.45 Mil/uL (ref 3.87–5.11)
RDW: 12.9 % (ref 11.5–15.5)
WBC: 8.8 10*3/uL (ref 4.0–10.5)

## 2014-12-24 MED ORDER — EFINACONAZOLE 10 % EX SOLN: 1.0000 "application " | Freq: Every day | CUTANEOUS | Status: DC

## 2014-12-24 NOTE — Patient Instructions (Signed)

## 2014-12-24 NOTE — Progress Notes (Deleted)
Patient ID: Tiffany Hamilton, female   DOB: [birth year], 34 y.o.   MRN: [id number]   Subjective:    Patient ID: Tiffany Hamilton, female    DOB: Jul 11, 1980, 34 y.o.   MRN: [id number]  Chief Complaint  Patient presents with  . Abdominal Pain    with diarrhea, nausea and vomiting x's a few days    HPI Patient is in today for ***  Past Medical History  Diagnosis Date  . Cyst of left kidney   . Thyroid disease     hypothyroidism  . Asthma   . Allergy   . IBS (irritable bowel syndrome)   . Eczema     Past Surgical History  Procedure Laterality Date  . Robotically assisted laparoscopic ureteral re-implantation Right 2005    Family History  Problem Relation Age of Onset  . Hypertension Mother   . Hypothyroidism Mother   . Hypothyroidism Father   . Asthma Father   . Cancer Maternal Uncle     pancreas  . Cancer Maternal Grandfather     throat  . Cancer Paternal Grandmother     lung  . Cancer Paternal Grandfather     throat    Social History   Social History  . Marital Status: Married    Spouse Name: N/A  . Number of Children: N/A  . Years of Education: N/A   Occupational History  . Not on file.   Social History Main Topics  . Smoking status: Former Smoker -- 10 years  . Smokeless tobacco: Never Used     Comment: Previous tobacco use was occasional for 10 years.  . Alcohol Use: 0.0 oz/week    0 Standard drinks or equivalent per week     Comment: 1 beer and 1 glass of wine each night.  . Drug Use: Not on file  . Sexual Activity: Not on file   Other Topics Concern  . Not on file   Social History Narrative   Engineering geologist degree   3 step children   1 dog   1 cat   Enjoys shopping, cleaning      Outpatient Prescriptions Prior to Visit  Medication Sig Dispense Refill  . albuterol (PROVENTIL HFA;VENTOLIN HFA) 108 (90 BASE) MCG/ACT inhaler Inhale 2 puffs into the lungs every 6 (six) hours as needed for wheezing or shortness of breath. 1  Inhaler 2  . beclomethasone (QVAR) 80 MCG/ACT inhaler Inhale 1 puff into the lungs 2 (two) times daily. 3 Inhaler 1  . calcium carbonate (TUMS) 500 MG chewable tablet Chew 1 tablet by mouth daily as needed for indigestion or heartburn.    . fluticasone (FLONASE) 50 MCG/ACT nasal spray Place 2 sprays into both nostrils daily. 48 g 3  . JOLESSA 0.15-0.03 MG tablet TAKE 1 TABLET DAILY 91 tablet 1  . levothyroxine (SYNTHROID, LEVOTHROID) 112 MCG tablet TAKE 1 TABLET DAILY 90 tablet 0  . nitrofurantoin (MACRODANTIN) 50 MG capsule One tab by mouth after intercourse 90 capsule 1  . budesonide-formoterol (SYMBICORT) 80-4.5 MCG/ACT inhaler Inhale 2 puffs into the lungs 2 (two) times daily. 3 Inhaler 1  . fluconazole (DIFLUCAN) 150 MG tablet One tab by mouth today, then repeat in 3 days if symptoms are not improved 2 tablet 0  . tretinoin (RETIN-A) 0.05 % cream Apply topically at bedtime. 45 g 0   No facility-administered medications prior to visit.    Allergies  Allergen Reactions  . Morphine And Related Hives  . Penicillins Hives  .  Septra [Sulfamethoxazole-Trimethoprim] Hives    Review of Systems  Constitutional: Positive for chills. Negative for fever and malaise/fatigue.  HENT: Negative for congestion.   Eyes: Negative for discharge.  Respiratory: Negative for shortness of breath.   Cardiovascular: Negative for chest pain, palpitations and leg swelling.  Gastrointestinal: Positive for nausea, vomiting and diarrhea. Negative for abdominal pain.  Genitourinary: Negative for dysuria.  Musculoskeletal: Negative for falls.  Skin: Negative for rash.  Neurological: Negative for loss of consciousness and headaches.  Endo/Heme/Allergies: Negative for environmental allergies.  Psychiatric/Behavioral: Negative for depression. The patient is not nervous/anxious.        Objective:    Physical Exam  BP 100/68 mmHg  Pulse 95  Temp(Src) 99.2 F (37.3 C) (Oral)  Wt 108 lb 12.8 oz (49.351 kg)   SpO2 98% Wt Readings from Last 3 Encounters:  12/24/14 108 lb 12.8 oz (49.351 kg)  09/25/14 108 lb 12.8 oz (49.351 kg)  04/03/14 107 lb 3.2 oz (48.626 kg)     Lab Results  Component Value Date   WBC 5.9 04/03/2014   HGB 14.4 04/03/2014   HCT 43.8 04/03/2014   PLT 267.0 04/03/2014   GLUCOSE 61* 04/03/2014   CHOL 180 04/03/2014   TRIG 99.0 04/03/2014   HDL 63.40 04/03/2014   LDLCALC 97 04/03/2014   ALT 12 06/12/2014   AST 15 06/12/2014   NA 135 04/03/2014   K 4.1 04/03/2014   CL 106 04/03/2014   CREATININE 0.8 04/03/2014   BUN 9 04/03/2014   CO2 25 04/03/2014   TSH 1.07 09/25/2014   MICROALBUR 1.1 04/04/2014    Lab Results  Component Value Date   TSH 1.07 09/25/2014   Lab Results  Component Value Date   WBC 5.9 04/03/2014   HGB 14.4 04/03/2014   HCT 43.8 04/03/2014   MCV 93.3 04/03/2014   PLT 267.0 04/03/2014   Lab Results  Component Value Date   NA 135 04/03/2014   K 4.1 04/03/2014   CO2 25 04/03/2014   GLUCOSE 61* 04/03/2014   BUN 9 04/03/2014   CREATININE 0.8 04/03/2014   BILITOT 0.7 06/12/2014   ALKPHOS 16* 06/12/2014   AST 15 06/12/2014   ALT 12 06/12/2014   PROT 7.0 06/12/2014   ALBUMIN 3.9 06/12/2014   CALCIUM 9.2 04/03/2014   GFR 85.56 04/03/2014   Lab Results  Component Value Date   CHOL 180 04/03/2014   Lab Results  Component Value Date   HDL 63.40 04/03/2014   Lab Results  Component Value Date   LDLCALC 97 04/03/2014   Lab Results  Component Value Date   TRIG 99.0 04/03/2014   Lab Results  Component Value Date   CHOLHDL 3 04/03/2014   No results found for: HGBA1C     Assessment & Plan:   Problem List Items Addressed This Visit    None      I have discontinued Ms. Pizzimenti tretinoin, budesonide-formoterol, and fluconazole. I am also having her maintain her albuterol, nitrofurantoin, levothyroxine, calcium carbonate, fluticasone, JOLESSA, and beclomethasone.  No orders of the defined types were placed in this  encounter.     Baird Kay, LPN

## 2014-12-24 NOTE — Progress Notes (Signed)
Patient ID: Tiffany Hamilton, female    DOB: 10/06/1980  Age: 34 y.o. MRN: [id number]    Subjective:  Subjective HPI Tiffany Hamilton presents for c/o nausea , V / diarrhea since Friday.  No NVD today.  Pt is also concerned because she is concerned her IBS will act up.  She has IBSD.       Review of Systems  Constitutional: Negative for diaphoresis, appetite change, fatigue and unexpected weight change.  Eyes: Negative for pain, redness and visual disturbance.  Respiratory: Negative for cough, chest tightness, shortness of breath and wheezing.   Cardiovascular: Negative for chest pain, palpitations and leg swelling.  Gastrointestinal: Positive for nausea, vomiting and diarrhea.  Endocrine: Negative for cold intolerance, heat intolerance, polydipsia, polyphagia and polyuria.  Genitourinary: Negative for dysuria, frequency and difficulty urinating.  Neurological: Negative for dizziness, light-headedness, numbness and headaches.    History Past Medical History  Diagnosis Date  . Cyst of left kidney   . Thyroid disease     hypothyroidism  . Asthma   . Allergy   . IBS (irritable bowel syndrome)   . Eczema     She has past surgical history that includes Robotically assisted laparoscopic ureteral re-implantation (Right, 2005).   Her family history includes Asthma in her father; Cancer in her maternal grandfather, maternal uncle, paternal grandfather, and paternal grandmother; Hypertension in her mother; Hypothyroidism in her father and mother.She reports that she has quit smoking. She has never used smokeless tobacco. She reports that she drinks alcohol. Her drug history is not on file.  Current Outpatient Prescriptions on File Prior to Visit  Medication Sig Dispense Refill  . albuterol (PROVENTIL HFA;VENTOLIN HFA) 108 (90 BASE) MCG/ACT inhaler Inhale 2 puffs into the lungs every 6 (six) hours as needed for wheezing or shortness of breath. 1 Inhaler 2  . beclomethasone (QVAR) 80 MCG/ACT  inhaler Inhale 1 puff into the lungs 2 (two) times daily. 3 Inhaler 1  . calcium carbonate (TUMS) 500 MG chewable tablet Chew 1 tablet by mouth daily as needed for indigestion or heartburn.    . fluticasone (FLONASE) 50 MCG/ACT nasal spray Place 2 sprays into both nostrils daily. 48 g 3  . JOLESSA 0.15-0.03 MG tablet TAKE 1 TABLET DAILY 91 tablet 1  . levothyroxine (SYNTHROID, LEVOTHROID) 112 MCG tablet TAKE 1 TABLET DAILY 90 tablet 0  . nitrofurantoin (MACRODANTIN) 50 MG capsule One tab by mouth after intercourse 90 capsule 1   No current facility-administered medications on file prior to visit.     Objective:  Objective Physical Exam  Constitutional: She is oriented to person, place, and time. She appears well-developed and well-nourished.  HENT:  Head: Normocephalic and atraumatic.  Eyes: Conjunctivae and EOM are normal.  Neck: Normal range of motion. Neck supple. No JVD present. Carotid bruit is not present. No thyromegaly present.  Cardiovascular: Normal rate, regular rhythm and normal heart sounds.   No murmur heard. Pulmonary/Chest: Effort normal and breath sounds normal. No respiratory distress. She has no wheezes. She has no rales. She exhibits no tenderness.  Musculoskeletal: She exhibits no edema.  Neurological: She is alert and oriented to person, place, and time.  Psychiatric: She has a normal mood and affect.  Nursing note and vitals reviewed.  BP 100/68 mmHg  Pulse 95  Temp(Src) 99.2 F (37.3 C) (Oral)  Wt 108 lb 12.8 oz (49.351 kg)  SpO2 98% Wt Readings from Last 3 Encounters:  12/24/14 108 lb 12.8 oz (49.351 kg)  09/25/14 108 lb  12.8 oz (49.351 kg)  04/03/14 107 lb 3.2 oz (48.626 kg)     Lab Results  Component Value Date   WBC 8.8 12/24/2014   HGB 13.7 12/24/2014   HCT 41.0 12/24/2014   PLT 257.0 12/24/2014   GLUCOSE 88 12/24/2014   CHOL 180 04/03/2014   TRIG 99.0 04/03/2014   HDL 63.40 04/03/2014   LDLCALC 97 04/03/2014   ALT 10 12/24/2014   AST 14  12/24/2014   NA 138 12/24/2014   K 4.6 12/24/2014   CL 103 12/24/2014   CREATININE 0.94 12/24/2014   BUN 11 12/24/2014   CO2 30 12/24/2014   TSH 1.07 09/25/2014   MICROALBUR 1.1 04/04/2014    No results found.   Assessment & Plan:  Plan I have discontinued Ms. Arvanitis tretinoin, budesonide-formoterol, and fluconazole. I am also having her start on Efinaconazole. Additionally, I am having her maintain her albuterol, nitrofurantoin, levothyroxine, calcium carbonate, fluticasone, JOLESSA, and beclomethasone.  Meds ordered this encounter  Medications  . Efinaconazole 10 % SOLN    Sig: Apply 1 application topically daily.    Dispense:  8 mL    Refill:  2    Problem List Items Addressed This Visit      Unprioritized   Nausea with vomiting    Improving ---no vomiting today Diarrhea improving Bland diet Gatorade Check labs      IBS (irritable bowel syndrome)   Relevant Orders   Ambulatory referral to Gastroenterology    Other Visit Diagnoses    Nausea vomiting and diarrhea    -  Primary    Relevant Orders    POCT urinalysis dipstick (Completed)    CBC (Completed)    Comprehensive metabolic panel (Completed)       Follow-up: Return if symptoms worsen or fail to improve.  Loreen Freud, DO

## 2014-12-24 NOTE — Progress Notes (Signed)
Pre visit review using our clinic review tool, if applicable. No additional management support is needed unless otherwise documented below in the visit note. 

## 2014-12-25 DIAGNOSIS — R112 Nausea with vomiting, unspecified: Secondary | ICD-10-CM | POA: Insufficient documentation

## 2014-12-25 NOTE — Assessment & Plan Note (Signed)
Improving ---no vomiting today Diarrhea improving Bland diet Gatorade Check labs

## 2014-12-25 NOTE — Telephone Encounter (Signed)
Medication Detail      Disp Refills Start End     levothyroxine (SYNTHROID, LEVOTHROID) 112 MCG tablet 90 tablet 0 08/20/2014     Sig: TAKE 1 TABLET DAILY    E-Prescribing Status: Receipt confirmed by pharmacy (08/20/2014 7:27 AM EDT)     Pharmacy    [facility] - [place], [place] - [address]   Refill sent per [facility] refill protocol/SLS

## 2014-12-31 ENCOUNTER — Encounter: Payer: Self-pay | Admitting: Family

## 2014-12-31 DIAGNOSIS — Z5181 Encounter for therapeutic drug level monitoring: Secondary | ICD-10-CM

## 2014-12-31 MED ORDER — ITRACONAZOLE 200 MG PO TABS: 1.0000 | ORAL_TABLET | Freq: Every day | ORAL | Status: DC

## 2015-01-01 ENCOUNTER — Encounter: Payer: Self-pay | Admitting: Gastroenterology

## 2015-01-03 ENCOUNTER — Other Ambulatory Visit: Payer: Self-pay | Admitting: Family

## 2015-01-15 ENCOUNTER — Encounter: Payer: Self-pay | Admitting: Family

## 2015-01-15 MED ORDER — MOMETASONE FUROATE 50 MCG/ACT NA SUSP: 2.0000 | Freq: Every day | NASAL | Status: DC

## 2015-01-15 MED ORDER — MONTELUKAST SODIUM 10 MG PO TABS: 10.0000 mg | ORAL_TABLET | Freq: Every day | ORAL | Status: DC

## 2015-01-15 NOTE — Telephone Encounter (Signed)
Tiffany Hamilton-- I spoke with pt. She states she has been using QVAR and it doesn't seem to be effective. Notes that she is doing the same exercises at the gym but finds she is getting more winded while using the QVAR.  States she was using Advair before and doesn't remember why it was changed. States the nasal spray she has been using is making her nose burn. Was unsure of the name. States she was unable to get the Flonase we prescribed in June because her insurance doesn't cover it. I spoke with an [facility] representative that said they shipped Nasonex on 05/31/14.  Notified pt of this and she states she never received nasonex. She received Nasalide dated 05/29/14 per [facility] website that she logged in to while talking with me on the phone. Pt is requesting Rx of nasonex and substitute for QVAR?  Please advise.

## 2015-01-15 NOTE — Telephone Encounter (Signed)
Spoke with pt. Advair can interact with antifungal rx. She really wants to clear up toenail fungus. Advised pt to try adding singulair, continue qvar, albuterol prn. Follow up in office if asthma symptoms do not improve after she begins singulair.

## 2015-01-31 ENCOUNTER — Other Ambulatory Visit (INDEPENDENT_AMBULATORY_CARE_PROVIDER_SITE_OTHER): Payer: BLUE CROSS/BLUE SHIELD

## 2015-01-31 DIAGNOSIS — Z5181 Encounter for therapeutic drug level monitoring: Secondary | ICD-10-CM

## 2015-01-31 LAB — HEPATIC FUNCTION PANEL
ALBUMIN: 3.8 g/dL (ref 3.6–5.1)
ALK PHOS: 18 U/L — AB (ref 33–115)
ALT: 14 U/L (ref 6–29)
AST: 16 U/L (ref 10–30)
BILIRUBIN INDIRECT: 0.9 mg/dL (ref 0.2–1.2)
Bilirubin, Direct: 0.2 mg/dL (ref <=0.2)
Total Bilirubin: 1.1 mg/dL (ref 0.2–1.2)
Total Protein: 6.6 g/dL (ref 6.1–8.1)

## 2015-02-01 ENCOUNTER — Other Ambulatory Visit: Payer: Self-pay | Admitting: Family

## 2015-02-01 ENCOUNTER — Encounter: Payer: Self-pay | Admitting: Family

## 2015-02-01 MED ORDER — ITRACONAZOLE 200 MG PO TABS: 1.0000 | ORAL_TABLET | Freq: Every day | ORAL | Status: DC

## 2015-02-06 ENCOUNTER — Other Ambulatory Visit: Payer: Self-pay | Admitting: Family

## 2015-02-06 ENCOUNTER — Encounter: Payer: Self-pay | Admitting: Family

## 2015-02-07 ENCOUNTER — Encounter: Payer: Self-pay | Admitting: Family

## 2015-02-07 ENCOUNTER — Ambulatory Visit (INDEPENDENT_AMBULATORY_CARE_PROVIDER_SITE_OTHER): Payer: BLUE CROSS/BLUE SHIELD | Admitting: Family

## 2015-02-07 VITALS — BP 106/62 | HR 92 | Temp 98.3°F | Ht 64.0 in | Wt 108.0 lb

## 2015-02-07 DIAGNOSIS — J069 Acute upper respiratory infection, unspecified: Secondary | ICD-10-CM

## 2015-02-07 LAB — POCT INFLUENZA A/B: Influenza A, POC: NEGATIVE

## 2015-02-07 MED ORDER — ITRACONAZOLE 200 MG PO TABS: 1.0000 | ORAL_TABLET | Freq: Every day | ORAL | Status: DC

## 2015-02-07 NOTE — Addendum Note (Signed)
Addended by: Scharlene GlossEWING, Alleigh Mollica B on: 02/07/2015 02:01 PM   Modules accepted: Orders

## 2015-02-07 NOTE — Progress Notes (Signed)
Pre visit review using our clinic review tool, if applicable. No additional management support is needed unless otherwise documented below in the visit note. 

## 2015-02-07 NOTE — Progress Notes (Signed)
Subjective:    Patient ID: Tiffany Hamilton, female    DOB: September 28, 1980, 34 y.o.   MRN: [id number]  HPI  Tiffany Hamilton is a 34 yr old female who presents today with chief complaint of shortness of breath. Feels fatigued/out of breath with walking.  + myalgia.  Denies fever.  Mild fever.  She reports one episode of wheezing.  She tried albuerol last night without significant improvement in her breathing.  She reports last night she had some ear pain.  Had neck stiffness last night but this is improved today.    Review of Systems    see HPI  Past Medical History  Diagnosis Date  . Cyst of left kidney   . Thyroid disease     hypothyroidism  . Asthma   . Allergy   . IBS (irritable bowel syndrome)   . Eczema     Social History   Social History  . Marital Status: Married    Spouse Name: N/A  . Number of Children: N/A  . Years of Education: N/A   Occupational History  . Not on file.   Social History Main Topics  . Smoking status: Former Smoker -- 10 years  . Smokeless tobacco: Never Used     Comment: Previous tobacco use was occasional for 10 years.  . Alcohol Use: 0.0 oz/week    0 Standard drinks or equivalent per week     Comment: 1 beer and 1 glass of wine each night.  . Drug Use: Not on file  . Sexual Activity: Not on file   Other Topics Concern  . 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 Robotically assisted laparoscopic ureteral re-implantation Right 2005    Family History  Problem Relation Age of Onset  . Hypertension Mother   . Hypothyroidism Mother   . Hypothyroidism Father   . Asthma Father   . Cancer Maternal Uncle     pancreas  . Cancer Maternal Grandfather     throat  . Cancer Paternal Grandmother     lung  . Cancer Paternal Grandfather     throat    Allergies  Allergen Reactions  .  Morphine And Related Hives  . Penicillins Hives  . Septra [Sulfamethoxazole-Trimethoprim] Hives    Current Outpatient Prescriptions on File Prior to Visit  Medication Sig Dispense Refill  . albuterol (PROVENTIL HFA;VENTOLIN HFA) 108 (90 BASE) MCG/ACT inhaler Inhale 2 puffs into the lungs every 6 (six) hours as needed for wheezing or shortness of breath. 1 Inhaler 2  . calcium carbonate (TUMS) 500 MG chewable tablet Chew 1 tablet by mouth daily as needed for indigestion or heartburn.    . Efinaconazole 10 % SOLN Apply 1 application topically daily. 8 mL 2  . Itraconazole 200 MG TABS Take 1 tablet by mouth daily. 30 tablet 1  . JOLESSA 0.15-0.03 MG tablet TAKE 1 TABLET DAILY 91 tablet 1  . levothyroxine (SYNTHROID, LEVOTHROID) 112 MCG tablet TAKE 1 TABLET DAILY 90 tablet 0  . mometasone (NASONEX) 50 MCG/ACT nasal spray Place 2 sprays into the nose daily. 51 g 0  . montelukast (SINGULAIR) 10 MG tablet Take 1 tablet (10 mg total) by mouth at bedtime. 90 tablet 0  . nitrofurantoin (MACRODANTIN) 50 MG capsule One tab  by mouth after intercourse 90 capsule 1   No current facility-administered medications on file prior to visit.    BP 106/62 mmHg  Pulse 92  Temp(Src) 98.3 F (36.8 C) (Oral)  Ht 5\' 4"  (1.626 m)  Wt 108 lb (48.988 kg)  BMI 18.53 kg/m2  SpO2 98%    Objective:   Physical Exam  Constitutional: She is oriented to person, place, and time. She appears well-developed and well-nourished.  HENT:  Head: Normocephalic and atraumatic.  Bilateral clear fluid noted behind TM's.  No bulging, no erythema.   Eyes: No scleral icterus.  Neck: Neck supple.  Cardiovascular: Normal rate and regular rhythm.   No murmur heard. Pulmonary/Chest: Effort normal and breath sounds normal. No respiratory distress. She has no wheezes. She has no rales. She exhibits no tenderness.  Neurological: She is alert and oriented to person, place, and time.  Skin: Skin is warm and dry.  Psychiatric: She has  a normal mood and affect. Her behavior is normal. Judgment and thought content normal.          Assessment & Plan:  Symptoms most consistent with viral URI. Advised pt to use motrin as needed for pain/myalgia. Continue albuterol every 6 hours for next few days to help prevent asthma symptoms. Call if symptoms worsen, if fever >101, or if symptoms are not improved in 3-4 days.   Rapid flu test is negative.

## 2015-02-07 NOTE — Patient Instructions (Signed)
Use motrin as needed for pain/myalgia. Continue albuterol every 6 hours for next few days to help prevent asthma symptoms. Call if symptoms worsen, if fever >101, or if symptoms are not improved in 3-4 days.    Upper Respiratory Infection, Adult  Most upper respiratory infections (URIs) are a viral infection of the air passages leading to the lungs. A URI affects the nose, throat, and upper air passages. The most common type of URI is nasopharyngitis and is typically referred to as "the common cold." URIs run their course and usually go away on their own. Most of the time, a URI does not require medical attention, but sometimes a bacterial infection in the upper airways can follow a viral infection. This is called a secondary infection. Sinus and middle ear infections are common types of secondary upper respiratory infections. Bacterial pneumonia can also complicate a URI. A URI can worsen asthma and chronic obstructive pulmonary disease (COPD). Sometimes, these complications can require emergency medical care and may be life threatening.  CAUSES Almost all URIs are caused by viruses. A virus is a type of germ and can spread from one person to another.  RISKS FACTORS You may be at risk for a URI if:   You smoke.   You have chronic heart or lung disease.  You have a weakened defense (immune) system.   You are very young or very old.   You have nasal allergies or asthma.  You work in crowded or poorly ventilated areas.  You work in health care facilities or schools. SIGNS AND SYMPTOMS  Symptoms typically develop 2-3 days after you come in contact with a cold virus. Most viral URIs last 7-10 days. However, viral URIs from the influenza virus (flu virus) can last 14-18 days and are typically more severe. Symptoms may include:   Runny or stuffy (congested) nose.   Sneezing.   Cough.   Sore throat.   Headache.   Fatigue.   Fever.   Loss of appetite.   Pain in your  forehead, behind your eyes, and over your cheekbones (sinus pain).  Muscle aches.  DIAGNOSIS  Your health care provider may diagnose a URI by:  Physical exam.  Tests to check that your symptoms are not due to another condition such as:  Strep throat.  Sinusitis.  Pneumonia.  Asthma. TREATMENT  A URI goes away on its own with time. It cannot be cured with medicines, but medicines may be prescribed or recommended to relieve symptoms. Medicines may help:  Reduce your fever.  Reduce your cough.  Relieve nasal congestion. HOME CARE INSTRUCTIONS   Take medicines only as directed by your health care provider.   Gargle warm saltwater or take cough drops to comfort your throat as directed by your health care provider.  Use a warm mist humidifier or inhale steam from a shower to increase air moisture. This may make it easier to breathe.  Drink enough fluid to keep your urine clear or pale yellow.   Eat soups and other clear broths and maintain good nutrition.   Rest as needed.   Return to work when your temperature has returned to normal or as your health care provider advises. You may need to stay home longer to avoid infecting others. You can also use a face mask and careful hand washing to prevent spread of the virus.  Increase the usage of your inhaler if you have asthma.   Do not use any tobacco products, including cigarettes, chewing tobacco, or  electronic cigarettes. If you need help quitting, ask your health care provider. PREVENTION  The best way to protect yourself from getting a cold is to practice good hygiene.   Avoid oral or hand contact with people with cold symptoms.   Wash your hands often if contact occurs.  There is no clear evidence that vitamin C, vitamin E, echinacea, or exercise reduces the chance of developing a cold. However, it is always recommended to get plenty of rest, exercise, and practice good nutrition.  SEEK MEDICAL CARE IF:   You  are getting worse rather than better.   Your symptoms are not controlled by medicine.   You have chills.  You have worsening shortness of breath.  You have brown or red mucus.  You have yellow or brown nasal discharge.  You have pain in your face, especially when you bend forward.  You have a fever.  You have swollen neck glands.  You have pain while swallowing.  You have white areas in the back of your throat. SEEK IMMEDIATE MEDICAL CARE IF:   You have severe or persistent:  Headache.  Ear pain.  Sinus pain.  Chest pain.  You have chronic lung disease and any of the following:  Wheezing.  Prolonged cough.  Coughing up blood.  A change in your usual mucus.  You have a stiff neck.  You have changes in your:  Vision.  Hearing.  Thinking.  Mood. MAKE SURE YOU:   Understand these instructions.  Will watch your condition.  Will get help right away if you are not doing well or get worse.   This information is not intended to replace advice given to you by your health care provider. Make sure you discuss any questions you have with your health care provider.   Document Released: 09/29/2000 Document Revised: 08/20/2014 Document Reviewed: 07/11/2013 Elsevier Interactive Patient Education [employer]2016 [employer]

## 2015-02-14 ENCOUNTER — Ambulatory Visit (HOSPITAL_COMMUNITY)
Admission: RE | Admit: 2015-02-14 | Discharge: 2015-02-14 | Disposition: A | Discharge status: Home / Self Care | Payer: BLUE CROSS/BLUE SHIELD | Source: Ambulatory Visit | Attending: Chiropractic Medicine | Admitting: Chiropractic Medicine

## 2015-02-14 ENCOUNTER — Other Ambulatory Visit (HOSPITAL_COMMUNITY): Payer: Self-pay | Admitting: Chiropractic Medicine

## 2015-02-14 DIAGNOSIS — M542 Cervicalgia: Secondary | ICD-10-CM | POA: Diagnosis present

## 2015-02-14 DIAGNOSIS — M545 Low back pain: Secondary | ICD-10-CM | POA: Diagnosis not present

## 2015-02-18 ENCOUNTER — Ambulatory Visit: Payer: BLUE CROSS/BLUE SHIELD

## 2015-02-19 ENCOUNTER — Encounter: Payer: Self-pay | Admitting: Family

## 2015-02-28 ENCOUNTER — Other Ambulatory Visit (INDEPENDENT_AMBULATORY_CARE_PROVIDER_SITE_OTHER): Payer: BLUE CROSS/BLUE SHIELD

## 2015-02-28 ENCOUNTER — Ambulatory Visit (INDEPENDENT_AMBULATORY_CARE_PROVIDER_SITE_OTHER): Payer: BLUE CROSS/BLUE SHIELD | Admitting: Gastroenterology

## 2015-02-28 ENCOUNTER — Ambulatory Visit (INDEPENDENT_AMBULATORY_CARE_PROVIDER_SITE_OTHER): Payer: BLUE CROSS/BLUE SHIELD | Admitting: Behavioral Health

## 2015-02-28 ENCOUNTER — Encounter: Payer: Self-pay | Admitting: Gastroenterology

## 2015-02-28 VITALS — BP 110/78 | HR 72 | Ht 63.25 in | Wt 106.5 lb

## 2015-02-28 DIAGNOSIS — R197 Diarrhea, unspecified: Secondary | ICD-10-CM

## 2015-02-28 DIAGNOSIS — K589 Irritable bowel syndrome without diarrhea: Secondary | ICD-10-CM | POA: Diagnosis not present

## 2015-02-28 DIAGNOSIS — Z23 Encounter for immunization: Secondary | ICD-10-CM | POA: Diagnosis not present

## 2015-02-28 DIAGNOSIS — K219 Gastro-esophageal reflux disease without esophagitis: Secondary | ICD-10-CM

## 2015-02-28 LAB — IGA: IGA: 200 mg/dL (ref 68–378)

## 2015-02-28 MED ORDER — OMEPRAZOLE 20 MG PO CPDR: 20.0000 mg | DELAYED_RELEASE_CAPSULE | Freq: Every day | ORAL | Status: DC

## 2015-02-28 NOTE — Progress Notes (Signed)
Tiffany Hamilton    [id number]    10-Mar-1981  Primary Care Physician:O'SULLIVAN,MELISSA S., NP  Referring Physician: Sandford Craze, NP [address] [place], [place] [address]  Chief complaint:  IBS HPI: 34 year old white female with history of irritable bowel syndrome here to establish care  She was diagnosed with irritable bowel syndrome in 2006 , she was seeing a gastroenterologist in [place] had a colonoscopy was normal except for small internal hemorrhoids. Her predominant symptom is diarrhea and bloating , usually has an episode every 2-3 weeks triggered by stress lactose products and raw vegetables . No history of constipation She also complained of increasing heartburn in the past 1-2 years, and she also has intermittent difficulty swallowing with solids which she sometimes has to regurgitate out. No history of food impaction She has occasional BRBPR from hemorrhoids which resolves in a day and is self  limited   Outpatient Encounter Prescriptions as of 02/28/2015  Medication Sig  . albuterol (PROVENTIL HFA;VENTOLIN HFA) 108 (90 BASE) MCG/ACT inhaler Inhale 2 puffs into the lungs every 6 (six) hours as needed for wheezing or shortness of breath.  . calcium carbonate (TUMS) 500 MG chewable tablet Chew 1 tablet by mouth daily as needed for indigestion or heartburn.  . itraconazole (SPORANOX) 100 MG capsule Take 1 capsule by mouth at bedtime.  . JOLESSA 0.15-0.03 MG tablet TAKE 1 TABLET DAILY  . levothyroxine (SYNTHROID, LEVOTHROID) 112 MCG tablet TAKE 1 TABLET DAILY  . mometasone (NASONEX) 50 MCG/ACT nasal spray Place 2 sprays into the nose daily.  . montelukast (SINGULAIR) 10 MG tablet Take 1 tablet (10 mg total) by mouth at bedtime.  . nitrofurantoin (MACRODANTIN) 50 MG capsule One tab by mouth after intercourse  . QVAR 80 MCG/ACT inhaler Take 2 puffs by mouth at bedtime.  Marland Kitchen omeprazole (PRILOSEC) 20 MG capsule Take 1 capsule (20 mg total) by mouth  daily.  . [DISCONTINUED] Efinaconazole 10 % SOLN Apply 1 application topically daily.  . [DISCONTINUED] Itraconazole 200 MG TABS Take 1 tablet by mouth daily.   No facility-administered encounter medications on file as of 02/28/2015.    Allergies as of 02/28/2015 - Review Complete 02/28/2015  Allergen Reaction Noted  . Morphine and related Hives 04/03/2014  . Penicillins Hives 04/03/2014  . Septra [sulfamethoxazole-trimethoprim] Hives 04/03/2014    Past Medical History  Diagnosis Date  . Cyst of left kidney   . Hypothyroidism   . Asthma   . Allergy   . IBS (irritable bowel syndrome)   . Eczema     Past Surgical History  Procedure Laterality Date  . Robotically assisted laparoscopic ureteral re-implantation Right 2005    Family History  Problem Relation Age of Onset  . Hypertension Mother   . Hypothyroidism Mother   . Hypothyroidism Father   . Asthma Father   . Pancreatic cancer Maternal Uncle   . Throat cancer Maternal Grandfather   . Lung cancer Paternal Grandmother   . Throat cancer Paternal Grandfather     Social History   Social History  . Marital Status: Married    Spouse Name: N/A  . Number of Children: 0  . Years of Education: N/A   Occupational History  . home maker    Social History Main Topics  . Smoking status: Former Smoker -- 10 years    Types: Cigarettes    Quit date: 02/28/2012  . Smokeless tobacco: Never Used     Comment:  Previous tobacco use was occasional for 10 years.  . Alcohol Use: 0.0 oz/week    0 Standard drinks or equivalent per week     Comment: 1 beer and 1 glass of wine each night.  . Drug Use: No  . Sexual Activity: Not on file   Other Topics Concern  . Not on file   Social History Narrative   Engineering geologistenior admin assistant   Bachelors degree   3 step children   1 dog   1 cat   Enjoys shopping, cleaning        Review of systems: Review of Systems  Constitutional: Negative for fever and chills.  HENT: Negative.     Eyes: Negative for blurred vision.  Respiratory: Negative for cough, shortness of breath and wheezing.   Cardiovascular: Negative for chest pain and palpitations.  Gastrointestinal: as per HPI Genitourinary: Negative for dysuria, urgency, frequency and hematuria.  Musculoskeletal: Negative for myalgias, back pain and joint pain.  Skin: Negative for itching and rash.  Neurological: Negative for dizziness, tremors, focal weakness, seizures and loss of consciousness.  Endo/Heme/Allergies: Negative for environmental allergies.  Psychiatric/Behavioral: Negative for depression, suicidal ideas and hallucinations.  All other systems reviewed and are negative.   Physical Exam: Filed Vitals:   02/28/15 1010  BP: 110/78  Pulse: 72   Gen:      No acute distress HEENT:  EOMI, sclera anicteric Neck:     No masses; no thyromegaly Lungs:    Clear to auscultation bilaterally; normal respiratory effort CV:         Regular rate and rhythm; no murmurs Abd:      + bowel sounds; soft, non-tender; no palpable masses, no distension Ext:    No edema; adequate peripheral perfusion Skin:      Warm and dry; no rash Neuro: alert and oriented x 3 Psych: normal mood and affect  Data Reviewed:  Reviewed pertinent labs in epic and discussed with patient   Assessment and Plan/Recommendations: 34 year old female with history of irritable bowel syndrome predominant diarrhea here to establish care Patient also complains of worsening reflux symptoms with intermittent solid dysphagia We'll do a trial of PPI for 2 months If continues have have persistent symptoms of dysphagia will consider EGD for evaluation Check TTG and IgA level to rule out celiac disease Low FODMAP diet Obtain records of previous colonoscopy and work up from [facility] [place] Return in 3 months

## 2015-02-28 NOTE — Patient Instructions (Signed)
Follow up in 3 months Go to the basement for labs today Low FODMAP diet given today  We will attempt to get your GI records from [place]

## 2015-03-03 LAB — TISSUE TRANSGLUTAMINASE, IGA: TISSUE TRANSGLUTAMINASE AB, IGA: 1 U/mL (ref <4)

## 2015-03-07 ENCOUNTER — Telehealth: Payer: Self-pay | Admitting: Family

## 2015-03-07 NOTE — Telephone Encounter (Signed)
Sorry Selena Battenody. I meant to add Miguel DibbleEbony Martin.

## 2015-03-07 NOTE — Telephone Encounter (Signed)
[place], could you [facility]

## 2015-03-07 NOTE — Telephone Encounter (Signed)
Patient is not mine nor have I seen her. Not sure why this was sent to me.  Will forward to PCP for review.

## 2015-03-07 NOTE — Telephone Encounter (Signed)
DOS 02/07/15 pt was billed for flu shot but she did not get flu shot that day bc she was sick. She came in for flu shot on 02/28/15. Please update billing.

## 2015-03-10 ENCOUNTER — Other Ambulatory Visit: Payer: Self-pay | Admitting: Family

## 2015-03-27 ENCOUNTER — Ambulatory Visit (INDEPENDENT_AMBULATORY_CARE_PROVIDER_SITE_OTHER): Payer: BLUE CROSS/BLUE SHIELD | Admitting: Medical

## 2015-03-27 ENCOUNTER — Encounter: Payer: Self-pay | Admitting: Medical

## 2015-03-27 VITALS — BP 100/60 | HR 65 | Temp 97.1°F | Ht 63.25 in | Wt 107.0 lb

## 2015-03-27 DIAGNOSIS — N390 Urinary tract infection, site not specified: Secondary | ICD-10-CM | POA: Diagnosis not present

## 2015-03-27 DIAGNOSIS — R300 Dysuria: Secondary | ICD-10-CM

## 2015-03-27 DIAGNOSIS — R82998 Other abnormal findings in urine: Secondary | ICD-10-CM

## 2015-03-27 LAB — POCT URINALYSIS DIPSTICK
Bilirubin, UA: NEGATIVE
GLUCOSE UA: NEGATIVE
Ketones, UA: NEGATIVE
NITRITE UA: NEGATIVE
PH UA: 6
Protein, UA: NEGATIVE
Spec Grav, UA: <=1.005
UROBILINOGEN UA: 0.2

## 2015-03-27 MED ORDER — NITROFURANTOIN MONOHYD MACRO 100 MG PO CAPS: 100.0000 mg | ORAL_CAPSULE | Freq: Two times a day (BID) | ORAL | Status: DC

## 2015-03-27 MED ORDER — FLUCONAZOLE 150 MG PO TABS: ORAL_TABLET | ORAL | Status: DC

## 2015-03-27 MED ORDER — NITROFURANTOIN MONOHYD MACRO 100 MG PO CAPS
100.0000 mg | ORAL_CAPSULE | Freq: Two times a day (BID) | ORAL | Status: DC
Start: 2015-03-27 — End: 2016-05-17

## 2015-03-27 NOTE — Patient Instructions (Signed)
Your appear to have a urinary tract infection. I am prescribing  macroibd antibiotic for the probable infection. Hydrate well. I am sending out a urine culture. During the interim if your signs and symptoms worsen rather than improving please notify us. We will notify your when the culture results are back.  Rx diflucan if needed as well for yeast infection.  Follow up in 7 days or as needed.

## 2015-03-27 NOTE — Progress Notes (Signed)
Subjective:    Patient ID: Tiffany Hamilton, female    DOB: 09-Jan-1981, 34 y.o.   MRN: [id number]  HPI   Pt in today reporting urinary symptoms.  Dysuria- yes today Frequent urination-yes Hesitancy-no Suprapubic pressure-yes Fever-no chills-no Nausea-no  Vomiting-no CVA pain-maybe some faint pain in her back on both sides. History of UTI-yes. States 3-4 uti a year. In past used preventativley. A while since her last infection Gross hematuria-yes.(some time in past had gross)  LMP- about 1.5 month ago. Pt is on seasonal.  Pt never had any resitance bacteria found on prior cultures.   Review of Systems  Constitutional: Negative for fever, chills and fatigue.  Respiratory: Negative for cough, shortness of breath and wheezing.   Cardiovascular: Negative for chest pain and palpitations.  Gastrointestinal: Negative for abdominal pain.  Genitourinary: Positive for dysuria and frequency. Negative for urgency, decreased urine volume, vaginal bleeding and vaginal pain.  Musculoskeletal: Positive for back pain.       Mild both side possible.  Hematological: Negative for adenopathy. Does not bruise/bleed easily.    Past Medical History  Diagnosis Date  . Cyst of left kidney   . Hypothyroidism   . Asthma   . Allergy   . IBS (irritable bowel syndrome)   . Eczema     Social History   Social History  . Marital Status: Married    Spouse Name: N/A  . Number of Children: 0  . Years of Education: N/A   Occupational History  . home maker    Social History Main Topics  . Smoking status: Former Smoker -- 10 years    Types: Cigarettes    Quit date: 02/28/2012  . Smokeless tobacco: Never Used     Comment: Previous tobacco use was occasional for 10 years.  . Alcohol Use: 0.0 oz/week    0 Standard drinks or equivalent per week     Comment: 1 beer and 1 glass of wine each night.  . Drug Use: No  . Sexual Activity: Not on file   Other Topics Concern  . Not on file   Social  History Narrative   Engineering geologistenior admin assistant   Bachelors degree   3 step children   1 dog   1 cat   Enjoys shopping, cleaning      Past Surgical History  Procedure Laterality Date  . Robotically assisted laparoscopic ureteral re-implantation Right 2005    Family History  Problem Relation Age of Onset  . Hypertension Mother   . Hypothyroidism Mother   . Hypothyroidism Father   . Asthma Father   . Pancreatic cancer Maternal Uncle   . Throat cancer Maternal Grandfather   . Lung cancer Paternal Grandmother   . Throat cancer Paternal Grandfather     Allergies  Allergen Reactions  . Morphine And Related Hives  . Penicillins Hives  . Septra [Sulfamethoxazole-Trimethoprim] Hives    Current Outpatient Prescriptions on File Prior to Visit  Medication Sig Dispense Refill  . albuterol (PROVENTIL HFA;VENTOLIN HFA) 108 (90 BASE) MCG/ACT inhaler Inhale 2 puffs into the lungs every 6 (six) hours as needed for wheezing or shortness of breath. 1 Inhaler 2  . itraconazole (SPORANOX) 100 MG capsule Take 1 capsule by mouth at bedtime.    . JOLESSA 0.15-0.03 MG tablet TAKE 1 TABLET DAILY 91 tablet 3  . levothyroxine (SYNTHROID, LEVOTHROID) 112 MCG tablet TAKE 1 TABLET DAILY 90 tablet 0  . mometasone (NASONEX) 50 MCG/ACT nasal spray Place 2 sprays into the  nose daily. 51 g 0  . montelukast (SINGULAIR) 10 MG tablet Take 1 tablet (10 mg total) by mouth at bedtime. 90 tablet 0  . omeprazole (PRILOSEC) 20 MG capsule Take 1 capsule (20 mg total) by mouth daily. 90 capsule 3  . QVAR 80 MCG/ACT inhaler Take 2 puffs by mouth at bedtime.     No current facility-administered medications on file prior to visit.    BP 100/60 mmHg  Pulse 65  Temp(Src) 97.1 F (36.2 C) (Oral)  Ht 5' 3.25" (1.607 m)  Wt 107 lb (48.535 kg)  BMI 18.79 kg/m2  SpO2 98%       Objective:   Physical Exam General  Mental Status- Alert. Orientation- Orientation x 4.   Skin General:- Normal. Moisture- Dry.  Temperature- Warm.  HEENT Head- normal.  Neck Neck- Supple.  Heart Ausculation-RRR  Lungs Ausculation- Clear, even, unlabored bilaterlly.    Abdomen Palpation/Percussion: Palpation and Percussion of the abdomen reveal- faint suprapubicTender, No Rebound tenderness, No Rigidity(guarding), No Palpable abdominal masses and No jar tenderness. No suprapubic tenderness. Liver:-Normal. Spleen:- Normal. Other Characteristics- No Costovertebral angle tenderness- Left or Costovertebral angle tenderness- Right.  Auscultation: Auscultation of the abdomen reveals- Bowel Sounds normal.  Back- faint bilateral cva tenderness       Assessment & Plan:  Hold off/dc  sporonox for next 7 days while on antibiotic. If get yeast infection can take diflucan. Pt agreed and understood.  Your appear to have a urinary tract infection. I am prescribing  macroibd antibiotic for the probable infection. Hydrate well. I am sending out a urine culture. During the interim if your signs and symptoms worsen rather than improving please notify us. We will notify your when the culture results are back.  Rx diflucan if needed as well for yeast infection.  Follow up in 7 days or as needed.

## 2015-03-27 NOTE — Progress Notes (Signed)
Pre visit review using our clinic review tool, if applicable. No additional management support is needed unless otherwise documented below in the visit note. 

## 2015-03-29 LAB — URINE CULTURE

## 2015-03-31 ENCOUNTER — Encounter: Payer: Self-pay | Admitting: Family

## 2015-04-07 ENCOUNTER — Encounter: Payer: BLUE CROSS/BLUE SHIELD | Admitting: Family

## 2015-04-16 ENCOUNTER — Other Ambulatory Visit: Payer: Self-pay | Admitting: Family

## 2015-04-16 MED ORDER — LEVOTHYROXINE SODIUM 112 MCG PO TABS: 112.0000 ug | ORAL_TABLET | Freq: Every day | ORAL | Status: DC

## 2015-04-16 NOTE — Telephone Encounter (Signed)
Caller name: Self   Can be reached: [phone]  Pharmacy:  [id number], [place] - [address] [phone] (Phone) [phone] (Fax)         Reason for call: Request refill on levothyroxine (SYNTHROID, LEVOTHROID) 112 MCG tablet [[id number]] Just enough to last until she see Melissa on 04/23/2015

## 2015-04-16 NOTE — Telephone Encounter (Signed)
#  15 and 0 [facility].

## 2015-04-22 ENCOUNTER — Telehealth: Payer: Self-pay | Admitting: *Deleted

## 2015-04-22 ENCOUNTER — Telehealth: Payer: Self-pay

## 2015-04-22 NOTE — Telephone Encounter (Signed)
[facility], initiated via Cover My Meds [Key [id number]], awaiting response for approval/SLS

## 2015-04-22 NOTE — Telephone Encounter (Signed)
Received fax from patient's pharmacy RE: Tiffany Hamilton, stating it will cost [phone]$1144.99 OOP; they report that Insurance will cover Itraconazole and/or Terbinafine; PA has already been started, do you want to continue with PA process and/or change medication to a formulary given/SLS Please Advise.

## 2015-04-22 NOTE — Telephone Encounter (Signed)
Medication: Review, verify sig & reconcile(including outside meds): completed Duplicates discarded: N/a DM supply source: N/a  Preferred Pharmacy and which med where: Local pharmacy: [id number] - [place], [place] - [address]   Allergies verified: completed   Immunization Status: Prompted for insurance verification: Yes Flu vaccine--02/28/15  A/P:   Changes to FH, PSH or Personal Hx: Reviewed and updated. Pap--5/15 reported by patient.  Would like a PAP smear with this visit.    Care Team Updated:  Yes ED/Hospital/Urgent Care Visits: No Prompted for: Updated insurance, contact information, forms:  Insurance changed to [employer]  Remind to bring: DPR information, advance directives: N/a  To Discuss with Provider: Would like provider to recheck toenails--fungus  Would like a hormone check--wants to make sure she's on the right birth control---experiencing hot flashes mainly at night

## 2015-04-23 ENCOUNTER — Ambulatory Visit (INDEPENDENT_AMBULATORY_CARE_PROVIDER_SITE_OTHER): Payer: 59 | Admitting: Family

## 2015-04-23 ENCOUNTER — Encounter: Payer: Self-pay | Admitting: Family

## 2015-04-23 VITALS — BP 116/74 | HR 70 | Temp 98.5°F | Resp 14 | Ht 64.0 in | Wt 106.8 lb

## 2015-04-23 DIAGNOSIS — B351 Tinea unguium: Secondary | ICD-10-CM | POA: Diagnosis not present

## 2015-04-23 DIAGNOSIS — Z0000 Encounter for general adult medical examination without abnormal findings: Secondary | ICD-10-CM

## 2015-04-23 DIAGNOSIS — L989 Disorder of the skin and subcutaneous tissue, unspecified: Secondary | ICD-10-CM | POA: Diagnosis not present

## 2015-04-23 LAB — CBC WITH DIFFERENTIAL/PLATELET
BASOS ABS: 0 10*3/uL (ref 0.0–0.1)
Basophils Relative: 0.2 % (ref 0.0–3.0)
EOS PCT: 0.4 % (ref 0.0–5.0)
Eosinophils Absolute: 0 10*3/uL (ref 0.0–0.7)
HCT: 44.1 % (ref 36.0–46.0)
HEMOGLOBIN: 14.7 g/dL (ref 12.0–15.0)
LYMPHS ABS: 1.3 10*3/uL (ref 0.7–4.0)
Lymphocytes Relative: 13.6 % (ref 12.0–46.0)
MCHC: 33.3 g/dL (ref 30.0–36.0)
MCV: 92.9 fl (ref 78.0–100.0)
MONO ABS: 0.6 10*3/uL (ref 0.1–1.0)
MONOS PCT: 5.8 % (ref 3.0–12.0)
NEUTROS PCT: 80 % — AB (ref 43.0–77.0)
Neutro Abs: 7.8 10*3/uL — ABNORMAL HIGH (ref 1.4–7.7)
Platelets: 296 10*3/uL (ref 150.0–400.0)
RBC: 4.74 Mil/uL (ref 3.87–5.11)
RDW: 13.3 % (ref 11.5–15.5)
WBC: 9.7 10*3/uL (ref 4.0–10.5)

## 2015-04-23 LAB — URINALYSIS, ROUTINE W REFLEX MICROSCOPIC
Bilirubin Urine: NEGATIVE
HGB URINE DIPSTICK: NEGATIVE
Ketones, ur: NEGATIVE
Leukocytes, UA: NEGATIVE
NITRITE: NEGATIVE
RBC / HPF: NONE SEEN (ref 0–?)
Total Protein, Urine: NEGATIVE
Urine Glucose: NEGATIVE
Urobilinogen, UA: 0.2 (ref 0.0–1.0)
WBC UA: NONE SEEN (ref 0–?)
pH: 6 (ref 5.0–8.0)

## 2015-04-23 LAB — BASIC METABOLIC PANEL
BUN: 9 mg/dL (ref 6–23)
CALCIUM: 9.4 mg/dL (ref 8.4–10.5)
CO2: 26 mEq/L (ref 19–32)
Chloride: 103 mEq/L (ref 96–112)
Creatinine, Ser: 0.89 mg/dL (ref 0.40–1.20)
GFR: 77.34 mL/min (ref >60.00)
GLUCOSE: 80 mg/dL (ref 70–99)
POTASSIUM: 4.2 meq/L (ref 3.5–5.1)
SODIUM: 135 meq/L (ref 135–145)

## 2015-04-23 LAB — LIPID PANEL
CHOLESTEROL: 175 mg/dL (ref 0–200)
HDL: 63.6 mg/dL (ref >39.00)
LDL Cholesterol: 94 mg/dL (ref 0–99)
NONHDL: 111.74
TRIGLYCERIDES: 89 mg/dL (ref 0.0–149.0)
Total CHOL/HDL Ratio: 3
VLDL: 17.8 mg/dL (ref 0.0–40.0)

## 2015-04-23 LAB — HEPATIC FUNCTION PANEL
ALK PHOS: 18 U/L — AB (ref 39–117)
ALT: 25 U/L (ref 0–35)
AST: 44 U/L — AB (ref 0–37)
Albumin: 4 g/dL (ref 3.5–5.2)
BILIRUBIN TOTAL: 0.7 mg/dL (ref 0.2–1.2)
Bilirubin, Direct: 0.1 mg/dL (ref 0.0–0.3)
Total Protein: 7.3 g/dL (ref 6.0–8.3)

## 2015-04-23 LAB — TSH: TSH: 1.79 u[IU]/mL (ref 0.35–4.50)

## 2015-04-23 MED ORDER — TERBINAFINE HCL 250 MG PO TABS: 250.0000 mg | ORAL_TABLET | Freq: Every day | ORAL | Status: DC

## 2015-04-23 NOTE — Assessment & Plan Note (Signed)
Continue healthy diet, exercise.  Obtain routine labs.  Pap up to date.

## 2015-04-23 NOTE — Progress Notes (Signed)
Pre visit review using our clinic review tool, if applicable. No additional management support is needed unless otherwise documented below in the visit note. 

## 2015-04-23 NOTE — Telephone Encounter (Signed)
I switched pt to terbinafine, no need to continue with PA. Thanks.

## 2015-04-23 NOTE — Telephone Encounter (Signed)
Noted  

## 2015-04-23 NOTE — Patient Instructions (Addendum)
Please complete lab work prior to leaving.   

## 2015-04-23 NOTE — Assessment & Plan Note (Signed)
Change sporanox to lamisil x 1 more month.  Sporonax non-formulary at current dose.

## 2015-04-23 NOTE — Progress Notes (Signed)
Subjective:    Patient ID: Tiffany Hamilton, female    DOB: [birth year], 35 y.o.   MRN: [id number]  HPI   Patient presents today for complete physical.  Immunizations: up to date Diet: reports healthy diet Exercise: reports that she is exercising 3x a week at the gym Pap Smear: 5/15-  With GYN- normal per patient, due 5/15  UTI- Pt reports that she had UTI mid December. She took Facilities manager, denies current symptoms.    Toenail Fungus- reports that it is "almost gone."     Review of Systems  Constitutional: Negative for unexpected weight change.  HENT: Negative for hearing loss and rhinorrhea.   Eyes: Negative for visual disturbance.  Respiratory: Negative for cough.   Cardiovascular: Negative for leg swelling.  Gastrointestinal: Negative for diarrhea.  Genitourinary: Negative for dysuria and frequency.  Musculoskeletal: Negative for myalgias and arthralgias.  Skin:       Eczema hands in the winter  Neurological: Negative for headaches.  Hematological: Negative for adenopathy.  Psychiatric/Behavioral:       Rare anxiety symptoms, mild       Past Medical History  Diagnosis Date  . Cyst of left kidney   . Hypothyroidism   . Asthma   . Allergy   . IBS (irritable bowel syndrome)   . Eczema     Social History   Social History  . Marital Status: Married    Spouse Name: N/A  . Number of Children: 0  . Years of Education: N/A   Occupational History  . home maker    Social History Main Topics  . Smoking status: Former Smoker -- 10 years    Types: Cigarettes    Quit date: 02/28/2012  . Smokeless tobacco: Never Used     Comment: Previous tobacco use was occasional for 10 years.  . Alcohol Use: 0.0 oz/week    0 Standard drinks or equivalent per week     Comment: 1 beer and 1 glass of wine each night.  . Drug Use: No  . Sexual Activity: Not on file   Other Topics Concern  . 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 Robotically assisted laparoscopic ureteral re-implantation Right 2005    Family History  Problem Relation Age of Onset  . Hypertension Mother   . Hypothyroidism Mother   . Hypothyroidism Father   . Asthma Father   . Pancreatic cancer Maternal Uncle   . Throat cancer Maternal Grandfather   . Lung cancer Paternal Grandmother   . Throat cancer Paternal Grandfather   . Other Brother     Brain Tumor    Allergies  Allergen Reactions  . Morphine And Related Hives  . Penicillins Hives  . Septra [Sulfamethoxazole-Trimethoprim] Hives    Current Outpatient Prescriptions on File Prior to Visit  Medication Sig Dispense Refill  . albuterol (PROVENTIL HFA;VENTOLIN HFA) 108 (90 BASE) MCG/ACT inhaler Inhale 2 puffs into the lungs every 6 (six) hours as needed for wheezing or shortness of breath. 1 Inhaler 2  . JOLESSA 0.15-0.03 MG tablet TAKE 1 TABLET DAILY 91 tablet 3  . levothyroxine (SYNTHROID, LEVOTHROID) 112 MCG tablet Take 1 tablet (112 mcg total) by mouth daily. 15 tablet 0  . mometasone (NASONEX) 50 MCG/ACT nasal spray Place  2 sprays into the nose daily. 51 g 0  . montelukast (SINGULAIR) 10 MG tablet Take 1 tablet (10 mg total) by mouth at bedtime. 90 tablet 0  . nitrofurantoin, macrocrystal-monohydrate, (MACROBID) 100 MG capsule Take 1 capsule (100 mg total) by mouth 2 (two) times daily. 14 capsule 0  . omeprazole (PRILOSEC) 20 MG capsule Take 1 capsule (20 mg total) by mouth daily. 90 capsule 3  . QVAR 80 MCG/ACT inhaler Take 2 puffs by mouth at bedtime.     No current facility-administered medications on file prior to visit.    BP 116/74 mmHg  Pulse 70  Temp(Src) 98.5 F (36.9 C) (Oral)  Resp 14  Ht 5\' 4"  (1.626 m)  Wt 106 lb 12.8 oz (48.444 kg)  BMI 18.32 kg/m2  SpO2 100%  LMP 01/21/2015    Objective:   Physical Exam Physical Exam   Constitutional: She is oriented to person, place, and time. She appears well-developed and well-nourished. No distress.  HENT:  Head: Normocephalic and atraumatic.  Right Ear: Tympanic membrane and ear canal normal.  Left Ear: Tympanic membrane and ear canal normal.  Mouth/Throat: Oropharynx is clear and moist.  Eyes: Pupils are equal, round, and reactive to light. No scleral icterus.  Neck: Normal range of motion. No thyromegaly present.  Cardiovascular: Normal rate and regular rhythm.   No murmur heard. Pulmonary/Chest: Effort normal and breath sounds normal. No respiratory distress. He has no wheezes. She has no rales. She exhibits no tenderness.  Abdominal: Soft. Bowel sounds are normal. He exhibits no distension and no mass. There is no tenderness. There is no rebound and no guarding.  Musculoskeletal: She exhibits no edema.  Lymphadenopathy:    She has no cervical adenopathy.  Neurological: She is alert and oriented to person, place, and time. She exhibits normal muscle tone. Coordination normal.  Skin: Skin is warm and dry. slight discoloration noted of bilateral great toenails but discoloration has grown out some.  Right forehead, slightly pink spot, not raised, not irregular.    Psychiatric: She has a normal mood and affect. Her behavior is normal. Judgment and thought content normal.  Breasts: Examined lying Right: Without masses, retractions, discharge or axillary adenopathy.  Left: Without masses, retractions, discharge or axillary adenopathy. Bilateral inverted nipples      Assessment & Plan:   She does complain of vaginal dryness, low libido, hot flashes at night. We did discuss alternate forms of birth control such as Sklya and getting her back in with GYN to discuss.    Skin lesion- pt is fair skinned. Benign appearing spot on forehead.  Monitor.       Assessment & Plan:

## 2015-04-25 ENCOUNTER — Other Ambulatory Visit: Payer: Self-pay | Admitting: Medical

## 2015-04-25 ENCOUNTER — Telehealth: Payer: Self-pay | Admitting: *Deleted

## 2015-04-25 ENCOUNTER — Telehealth: Payer: Self-pay | Admitting: Family

## 2015-04-25 DIAGNOSIS — R7989 Other specified abnormal findings of blood chemistry: Secondary | ICD-10-CM

## 2015-04-25 DIAGNOSIS — R945 Abnormal results of liver function studies: Principal | ICD-10-CM

## 2015-04-25 MED ORDER — NITROFURANTOIN MACROCRYSTAL 50 MG PO CAPS: ORAL_CAPSULE | ORAL | Status: DC

## 2015-04-25 MED ORDER — MONTELUKAST SODIUM 10 MG PO TABS: 10.0000 mg | ORAL_TABLET | Freq: Every day | ORAL | Status: DC

## 2015-04-25 MED ORDER — QVAR 80 MCG/ACT IN AERS: 2.0000 | INHALATION_SPRAY | Freq: Every day | RESPIRATORY_TRACT | Status: DC

## 2015-04-25 MED ORDER — MOMETASONE FUROATE 50 MCG/ACT NA SUSP: 2.0000 | Freq: Every day | NASAL | Status: DC

## 2015-04-25 MED ORDER — LEVOTHYROXINE SODIUM 112 MCG PO TABS: 112.0000 ug | ORAL_TABLET | Freq: Every day | ORAL | Status: DC

## 2015-04-25 NOTE — Telephone Encounter (Signed)
Notified pt and she voices understanding. Lab appt scheduled for 05/27/15 at 11am and future lab order entered.

## 2015-04-25 NOTE — Telephone Encounter (Signed)
Rx filled already per Melissa.

## 2015-04-25 NOTE — Telephone Encounter (Signed)
-----   Message from Sandford CrazeMelissa O'Sullivan, NP sent at 04/24/2015 11:37 AM EST ----- Very mild elevation of her AST.  I would recommend that she hold off on starting lamisil, repeat lft in 1 month, dx elevated lft.

## 2015-04-25 NOTE — Telephone Encounter (Signed)
Refills sent on below Rxs except Nitrofurantoin.  Pt told me she was taking these as needed.  Please advise re: current directions of twice a day?

## 2015-04-29 ENCOUNTER — Other Ambulatory Visit: Payer: Self-pay | Admitting: Family

## 2015-04-29 NOTE — Telephone Encounter (Signed)
Sent 90 day supply of levothyroxine to [facility] on 04/25/15.  [facility]  Left detailed message on pt's voicemail to call and verify if she is requesting 2 week supply until mail order supply arrives or if this request can be discarded?

## 2015-04-29 NOTE — Telephone Encounter (Signed)
Pt states she does need to have 15 day [facility] until [facility] arrives.   Pt needing to change other meds due to insurance coverage. She has been using Qvar and wanting to change to Advair for plan coverage. Also was using nasonex and needs to change to generic flonase for insurance coverage. Ok to send both to the mail order pharmacy.

## 2015-04-29 NOTE — Telephone Encounter (Signed)
Email sent to charge correction to have the flu shot removed from patients bill, can take 6-8 weeks for claim to be reprocessed.

## 2015-04-30 MED ORDER — FLUTICASONE PROPIONATE 50 MCG/ACT NA SUSP: 2.0000 | Freq: Every day | NASAL | Status: DC

## 2015-04-30 MED ORDER — FLUTICASONE-SALMETEROL 250-50 MCG/DOSE IN AEPB: 1.0000 | INHALATION_SPRAY | Freq: Two times a day (BID) | RESPIRATORY_TRACT | Status: DC

## 2015-04-30 NOTE — Addendum Note (Signed)
Addended by: Sandford Craze'SULLIVAN, Shali Vesey on: 04/30/2015 02:38 PM   Modules accepted: Orders, Medications

## 2015-04-30 NOTE — Telephone Encounter (Signed)
2 week supply of levothyroxine sent.  Please advise re: QVAR and Nasonex alternatives below?

## 2015-05-22 ENCOUNTER — Telehealth: Payer: Self-pay | Admitting: Family

## 2015-05-22 MED ORDER — FLUTICASONE PROPIONATE 50 MCG/ACT NA SUSP: 2.0000 | Freq: Every day | NASAL | Status: DC

## 2015-05-22 NOTE — Telephone Encounter (Signed)
Pt would like a 90 day supply of Flonase sent to [facility].  Rx sent as requested.

## 2015-05-22 NOTE — Telephone Encounter (Signed)
Pt requesting call back to discuss nasal sprays. She's been on 3 types and is needing a reorder but does not want the nasonex. Cannot remember which one it was that worked best for her. Please call.

## 2015-05-27 ENCOUNTER — Other Ambulatory Visit: Payer: 59

## 2015-05-30 ENCOUNTER — Other Ambulatory Visit (INDEPENDENT_AMBULATORY_CARE_PROVIDER_SITE_OTHER): Payer: 59

## 2015-05-30 DIAGNOSIS — R7989 Other specified abnormal findings of blood chemistry: Secondary | ICD-10-CM

## 2015-05-30 DIAGNOSIS — R945 Abnormal results of liver function studies: Principal | ICD-10-CM

## 2015-05-30 LAB — HEPATIC FUNCTION PANEL
ALK PHOS: 17 U/L — AB (ref 39–117)
ALT: 12 U/L (ref 0–35)
AST: 17 U/L (ref 0–37)
Albumin: 4 g/dL (ref 3.5–5.2)
BILIRUBIN DIRECT: 0.2 mg/dL (ref 0.0–0.3)
BILIRUBIN TOTAL: 0.9 mg/dL (ref 0.2–1.2)
TOTAL PROTEIN: 7.4 g/dL (ref 6.0–8.3)

## 2015-05-31 ENCOUNTER — Encounter: Payer: Self-pay | Admitting: Family

## 2015-07-04 ENCOUNTER — Encounter: Payer: Self-pay | Admitting: Family

## 2015-07-04 MED ORDER — TERBINAFINE HCL 250 MG PO TABS
250.0000 mg | ORAL_TABLET | Freq: Every day | ORAL | Status: DC
Start: 2015-07-04 — End: 2016-04-26

## 2015-07-21 IMAGING — US US RENAL
1 series · 14 of 25 positions shown · non-contrast
Comparison: None.

CLINICAL DATA: S/p right ureter reimplantation in 3770 due to
reflux. Has known duplicated ureters bilaterally. Also has known
renal scarring from multiple UTI's and hydronephrosis since birth.
Some slight pinching pain on left, known left renal cyst. No other
surgeries.

EXAM:
RENAL/URINARY TRACT ULTRASOUND COMPLETE

[Series 1: us renal · 0.17mm/px · 56 acquisitions, 14 frames shown]
[im 1/56]
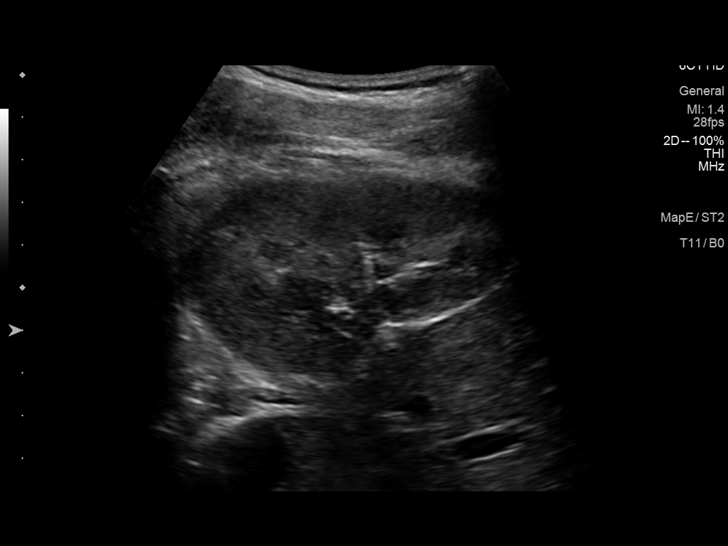
[im 5/56]
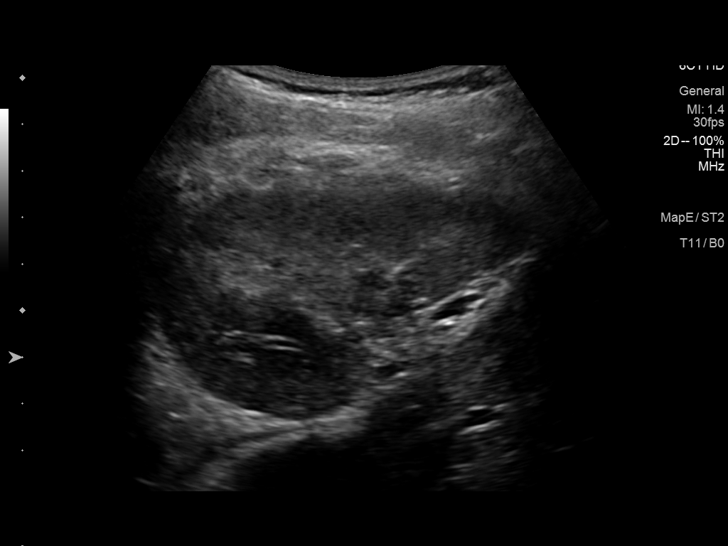
[im 10/56]
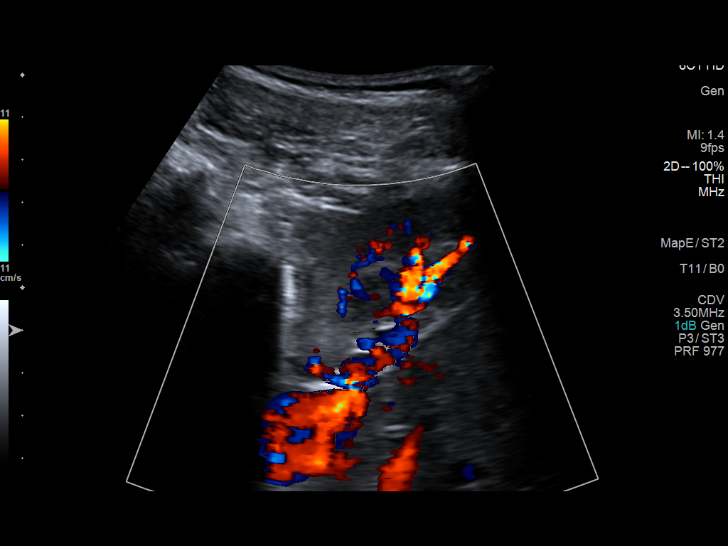
[im 14/56]
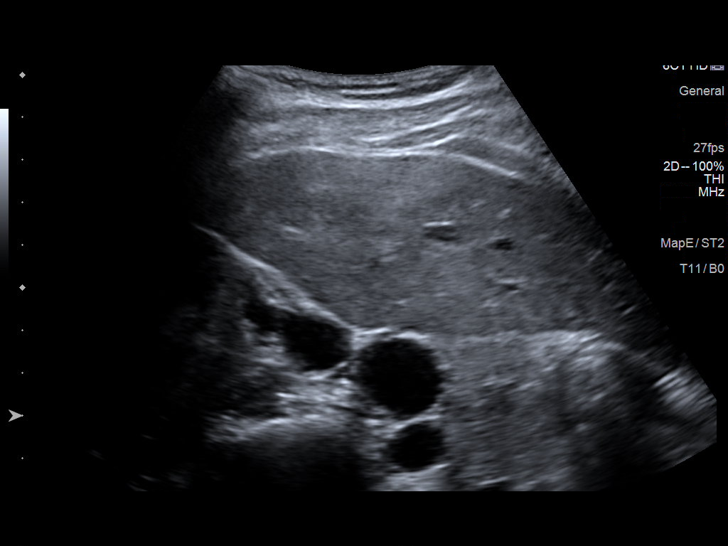
[im 19/56]
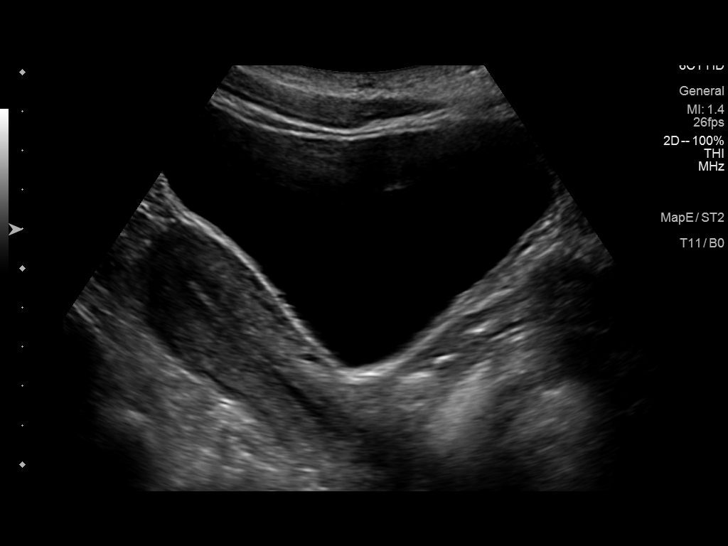
[im 21/56]
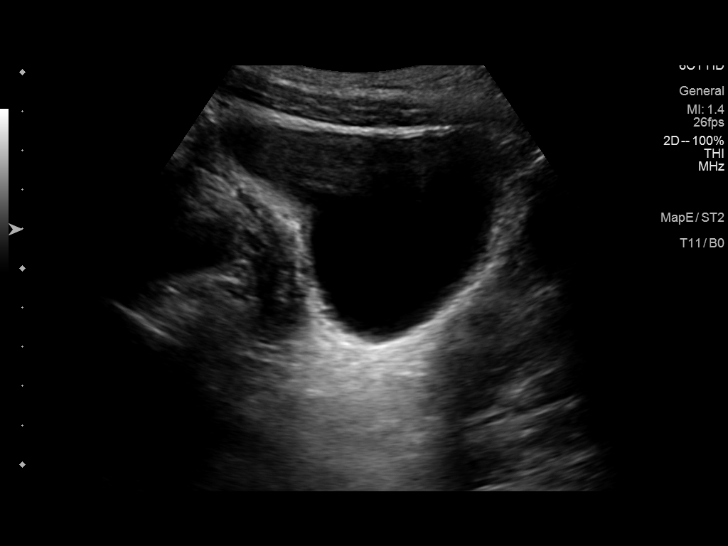
[im 26/56]
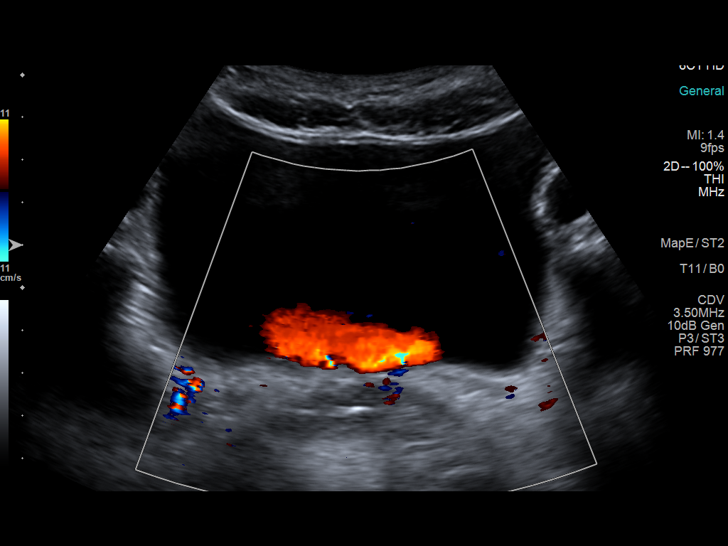
[im 30/56]
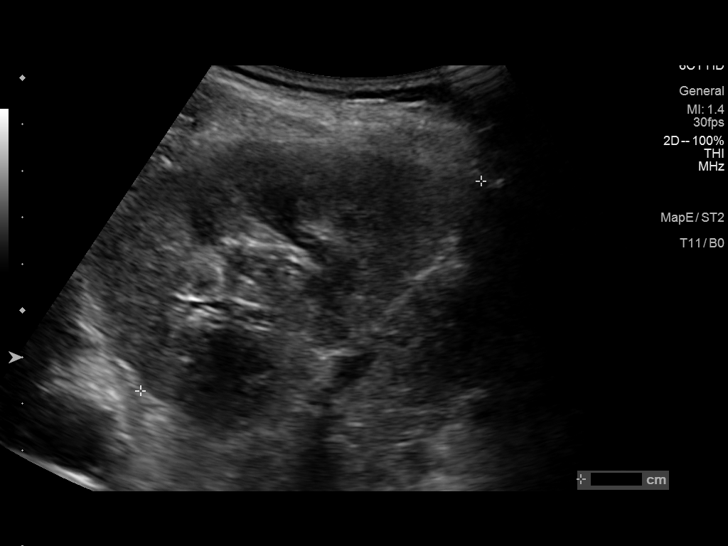
[im 35/56]
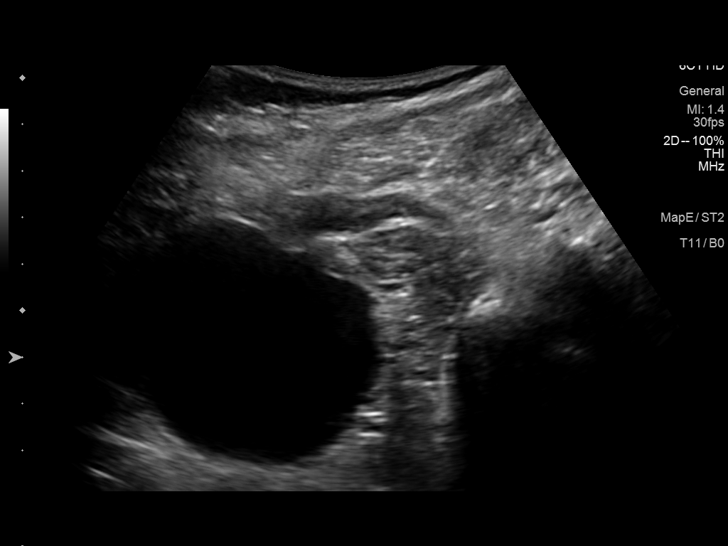
[im 37/56]
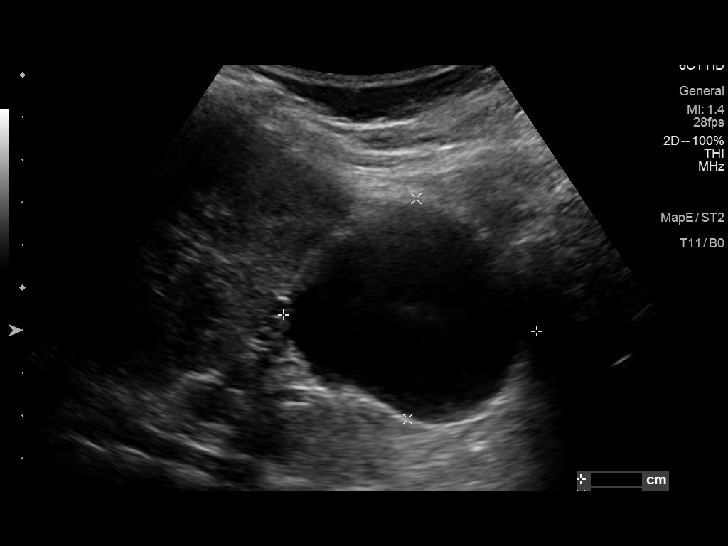
[im 42/56]
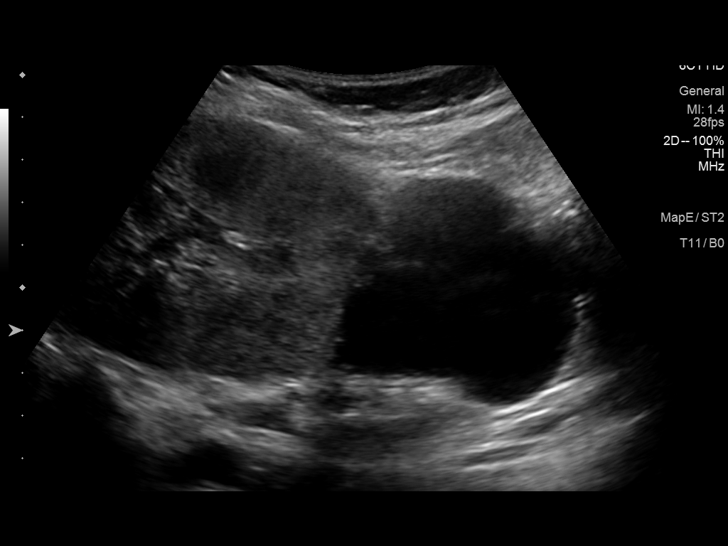
[im 46/56]
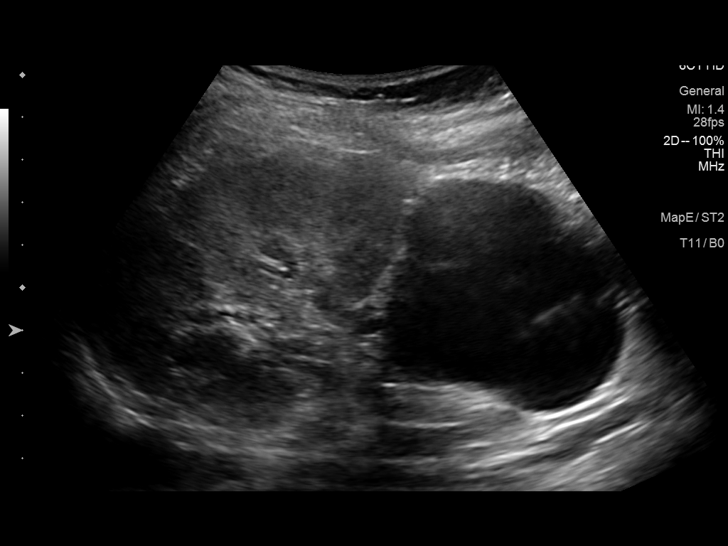
[im 51/56]
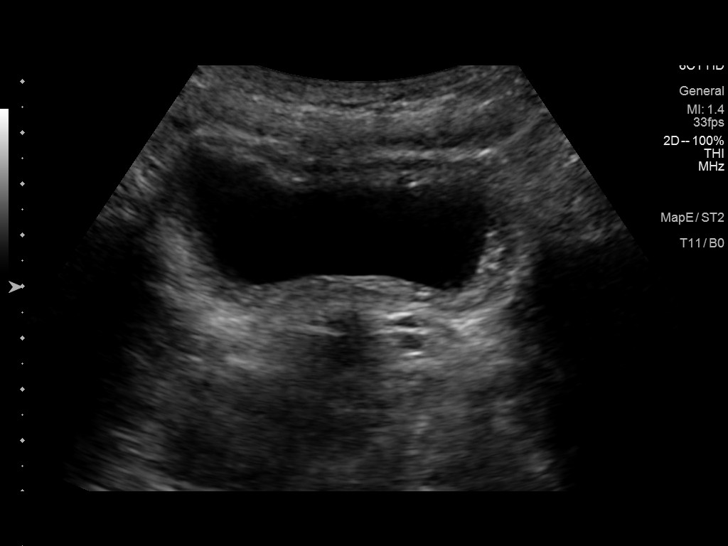
[im 56/56]
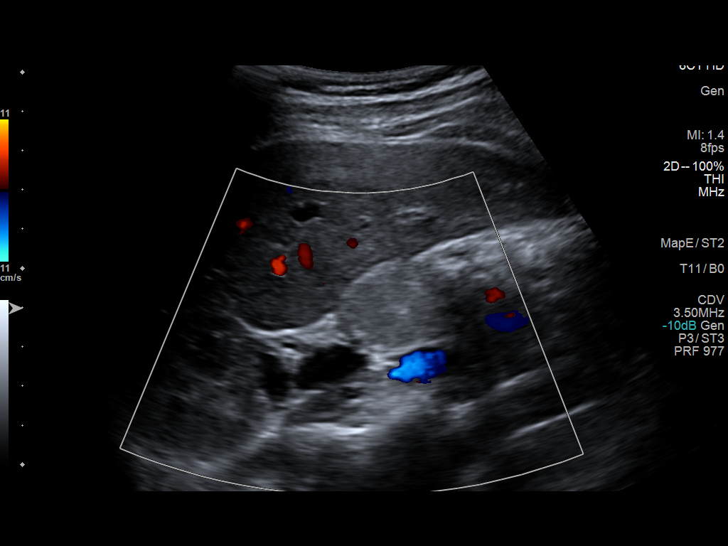

[14 of 25 positions shown; findings below may reference images not displayed]

FINDINGS: Right Kidney:

Length: 8.4 cm.. Normal parenchymal echogenicity. Duplicated
collecting system. No mass. No hydronephrosis. Kidney appears under
rotated. One of the duplicated ureters was initially dilated,
decompressing following bladder emptying.

Left Kidney:

Length: 8.6 cm.. Exophytic cyst arises from the lower pole measuring
6 cm x 5.2 cm x 5.7 cm. No other renal masses. No hydronephrosis.
Kidney appears on the rotated.

Bladder:

Unremarkable.  Bilateral ureteral jets noted.
IMPRESSION: 1. No acute findings. No hydronephrosis. 1 of the duplicated right
ureters was dilated proximally on the initial images but
decompressed following voiding.
2. 6 cm left lower pole exophytic cyst.  No other renal masses.
3. Unremarkable bladder.

## 2015-07-27 ENCOUNTER — Encounter: Payer: Self-pay | Admitting: Family

## 2015-07-28 ENCOUNTER — Other Ambulatory Visit: Payer: Self-pay

## 2015-07-28 ENCOUNTER — Other Ambulatory Visit: Payer: Self-pay | Admitting: Family

## 2015-07-28 MED ORDER — LEVONORGEST-ETH ESTRAD 91-DAY 0.15-0.03 MG PO TABS: 1.0000 | ORAL_TABLET | Freq: Every day | ORAL | Status: DC

## 2015-07-28 NOTE — Telephone Encounter (Signed)
Relation to ZO:XWRUpt:self Call back number: [phone] [id number] [place] - [address] [phone] (Phone) [phone])         Reason for call:  Patient requesting a refill JOLESSA 0.15-0.03 MG tablet. Patient is going out of town today, advised patient NP is out of the office. Please advise

## 2015-07-29 NOTE — Telephone Encounter (Signed)
   Medication Detail       Disp Refills Start End      levonorgestrel-ethinyl estradiol (JOLESSA) 0.15-0.03 MG tablet 1 Package 3 07/28/2015      Sig - Route: Take 1 tablet by mouth daily. - Oral     E-Prescribing Status: Receipt confirmed by pharmacy (07/28/2015 4:59 PM EDT)

## 2015-08-05 ENCOUNTER — Encounter: Payer: Self-pay | Admitting: Family

## 2015-08-06 NOTE — Telephone Encounter (Signed)
Melissa-- please advise? 

## 2015-08-12 ENCOUNTER — Telehealth: Payer: Self-pay | Admitting: *Deleted

## 2015-08-12 MED ORDER — LEVONORGEST-ETH ESTRAD 91-DAY 0.15-0.03 MG PO TABS: 1.0000 | ORAL_TABLET | Freq: Every day | ORAL | Status: DC

## 2015-08-12 NOTE — Telephone Encounter (Signed)
Received fax from [facility] requesting new prescription of Levonor/Et Estra. Rx sent.

## 2015-09-29 ENCOUNTER — Other Ambulatory Visit: Payer: Self-pay | Admitting: Family

## 2015-09-29 NOTE — Telephone Encounter (Signed)
90 day supply of levothyroxine and advair sent to pharmacy. Pt is due for 6 month follow up on 10/21/15.  Please call pt to schedule appt. Thanks!

## 2015-10-09 ENCOUNTER — Encounter: Payer: Self-pay | Admitting: Family

## 2015-10-09 MED ORDER — FLUCONAZOLE 150 MG PO TABS: ORAL_TABLET | ORAL | Status: DC

## 2015-10-28 ENCOUNTER — Encounter: Payer: Self-pay | Admitting: Family

## 2015-10-28 MED ORDER — SUMATRIPTAN SUCCINATE 50 MG PO TABS: ORAL_TABLET | ORAL | Status: DC

## 2015-10-28 MED ORDER — ONDANSETRON HCL 4 MG PO TABS: 4.0000 mg | ORAL_TABLET | Freq: Three times a day (TID) | ORAL | Status: DC | PRN

## 2015-11-12 ENCOUNTER — Other Ambulatory Visit: Payer: Self-pay | Admitting: Family

## 2015-12-01 ENCOUNTER — Other Ambulatory Visit: Payer: Self-pay | Admitting: Family

## 2015-12-01 NOTE — Telephone Encounter (Signed)
Patient was seen in office 10/14/15 for yeast infection. Patient is requesting medication again for possible yeast infection. Please advise.

## 2016-01-23 ENCOUNTER — Other Ambulatory Visit: Payer: Self-pay | Admitting: Family

## 2016-01-23 ENCOUNTER — Telehealth: Payer: Self-pay | Admitting: Family

## 2016-01-23 NOTE — Telephone Encounter (Signed)
Refill sent.

## 2016-01-23 NOTE — Telephone Encounter (Signed)
Relation to WG:NFAOpt:self Call back [phone] [id number] [place], [place] - [address] [phone] (Phone) [phone])     Reason for call:  Patient requesting a refill SUMAtriptan (IMITREX) 50 MG tablet

## 2016-01-26 ENCOUNTER — Encounter: Payer: Self-pay | Admitting: Family

## 2016-01-26 DIAGNOSIS — B351 Tinea unguium: Secondary | ICD-10-CM

## 2016-02-03 ENCOUNTER — Telehealth: Payer: Self-pay | Admitting: Family

## 2016-02-03 ENCOUNTER — Encounter: Payer: Self-pay | Admitting: Family

## 2016-02-03 NOTE — Telephone Encounter (Signed)
Dr Leonie ManStinehelfer 12/14 @2 :50, pt aware letter sent

## 2016-02-03 NOTE — Telephone Encounter (Signed)
Caller name: Relationship to patient: Self Can be reached: [phone] Pharmacy:  Reason for call: Patient called stating that she was referred to [facility] and after reading the reviews she does not want to go there because the reviews state they are unprofessional and makes fun of patients. Would like another referral.

## 2016-02-03 NOTE — Telephone Encounter (Signed)
Could you please try to arrange her at [facility] instead? Thanks

## 2016-02-16 ENCOUNTER — Ambulatory Visit (INDEPENDENT_AMBULATORY_CARE_PROVIDER_SITE_OTHER): Payer: 59

## 2016-02-16 DIAGNOSIS — Z23 Encounter for immunization: Secondary | ICD-10-CM

## 2016-04-22 ENCOUNTER — Other Ambulatory Visit: Payer: Self-pay | Admitting: Family

## 2016-04-23 MED ORDER — SUMATRIPTAN SUCCINATE 50 MG PO TABS: ORAL_TABLET | ORAL | 0 refills | Status: DC

## 2016-04-26 ENCOUNTER — Encounter: Payer: Self-pay | Admitting: Family

## 2016-04-26 ENCOUNTER — Ambulatory Visit (INDEPENDENT_AMBULATORY_CARE_PROVIDER_SITE_OTHER): Payer: 59 | Admitting: Family

## 2016-04-26 VITALS — BP 129/87 | HR 85 | Temp 98.4°F | Resp 16 | Ht 63.0 in | Wt 105.6 lb

## 2016-04-26 DIAGNOSIS — E039 Hypothyroidism, unspecified: Secondary | ICD-10-CM | POA: Diagnosis not present

## 2016-04-26 DIAGNOSIS — K589 Irritable bowel syndrome without diarrhea: Secondary | ICD-10-CM

## 2016-04-26 DIAGNOSIS — L309 Dermatitis, unspecified: Secondary | ICD-10-CM

## 2016-04-26 DIAGNOSIS — J309 Allergic rhinitis, unspecified: Secondary | ICD-10-CM

## 2016-04-26 DIAGNOSIS — J45909 Unspecified asthma, uncomplicated: Secondary | ICD-10-CM

## 2016-04-26 DIAGNOSIS — Z0000 Encounter for general adult medical examination without abnormal findings: Secondary | ICD-10-CM | POA: Diagnosis not present

## 2016-04-26 DIAGNOSIS — F411 Generalized anxiety disorder: Secondary | ICD-10-CM

## 2016-04-26 LAB — CBC WITH DIFFERENTIAL/PLATELET
BASOS PCT: 0.9 % (ref 0.0–3.0)
Basophils Absolute: 0.1 10*3/uL (ref 0.0–0.1)
EOS PCT: 4.7 % (ref 0.0–5.0)
Eosinophils Absolute: 0.3 10*3/uL (ref 0.0–0.7)
HCT: 42.9 % (ref 36.0–46.0)
Hemoglobin: 14.8 g/dL (ref 12.0–15.0)
LYMPHS ABS: 1.4 10*3/uL (ref 0.7–4.0)
Lymphocytes Relative: 24.1 % (ref 12.0–46.0)
MCHC: 34.5 g/dL (ref 30.0–36.0)
MCV: 91.2 fl (ref 78.0–100.0)
MONO ABS: 0.5 10*3/uL (ref 0.1–1.0)
MONOS PCT: 9.2 % (ref 3.0–12.0)
NEUTROS ABS: 3.6 10*3/uL (ref 1.4–7.7)
NEUTROS PCT: 61.1 % (ref 43.0–77.0)
PLATELETS: 276 10*3/uL (ref 150.0–400.0)
RBC: 4.71 Mil/uL (ref 3.87–5.11)
RDW: 12.8 % (ref 11.5–15.5)
WBC: 5.9 10*3/uL (ref 4.0–10.5)

## 2016-04-26 LAB — LIPID PANEL
CHOLESTEROL: 181 mg/dL (ref 0–200)
HDL: 83.5 mg/dL (ref >39.00)
LDL Cholesterol: 86 mg/dL (ref 0–99)
NonHDL: 97.03
TRIGLYCERIDES: 57 mg/dL (ref 0.0–149.0)
Total CHOL/HDL Ratio: 2
VLDL: 11.4 mg/dL (ref 0.0–40.0)

## 2016-04-26 LAB — URINALYSIS, ROUTINE W REFLEX MICROSCOPIC
BILIRUBIN URINE: NEGATIVE
Ketones, ur: NEGATIVE
LEUKOCYTES UA: NEGATIVE
NITRITE: NEGATIVE
Specific Gravity, Urine: 1.02 (ref 1.000–1.030)
TOTAL PROTEIN, URINE-UPE24: NEGATIVE
Urine Glucose: NEGATIVE
Urobilinogen, UA: 0.2 (ref 0.0–1.0)
pH: 6 (ref 5.0–8.0)

## 2016-04-26 LAB — BASIC METABOLIC PANEL
BUN: 13 mg/dL (ref 6–23)
CHLORIDE: 101 meq/L (ref 96–112)
CO2: 28 meq/L (ref 19–32)
Calcium: 9.7 mg/dL (ref 8.4–10.5)
Creatinine, Ser: 0.85 mg/dL (ref 0.40–1.20)
GFR: 81.07 mL/min (ref >60.00)
GLUCOSE: 79 mg/dL (ref 70–99)
Potassium: 4.2 mEq/L (ref 3.5–5.1)
Sodium: 136 mEq/L (ref 135–145)

## 2016-04-26 LAB — HEPATIC FUNCTION PANEL
ALBUMIN: 4.1 g/dL (ref 3.5–5.2)
ALT: 19 U/L (ref 0–35)
AST: 19 U/L (ref 0–37)
Alkaline Phosphatase: 20 U/L — ABNORMAL LOW (ref 39–117)
Bilirubin, Direct: 0.1 mg/dL (ref 0.0–0.3)
Total Bilirubin: 0.7 mg/dL (ref 0.2–1.2)
Total Protein: 7.4 g/dL (ref 6.0–8.3)

## 2016-04-26 LAB — TSH: TSH: 1.72 u[IU]/mL (ref 0.35–4.50)

## 2016-04-26 MED ORDER — MOMETASONE FUROATE 50 MCG/ACT NA SUSP: 2.0000 | Freq: Every day | NASAL | 12 refills | Status: DC

## 2016-04-26 MED ORDER — TACROLIMUS 0.03 % EX OINT: TOPICAL_OINTMENT | Freq: Two times a day (BID) | CUTANEOUS | 0 refills | Status: DC

## 2016-04-26 NOTE — Assessment & Plan Note (Signed)
Will refer for counseling. She has had a lot of stress and life changes recently.

## 2016-04-26 NOTE — Patient Instructions (Addendum)
Please complete lab work prior to leaving. You will be contacted about your referral to GYN. Restart nasonex for your allergic rhinitis. You may use protopic as needed. When eczema resolved, stop using. Please schedule an appointment with a counselor [phone] for anxiety.

## 2016-04-26 NOTE — Assessment & Plan Note (Signed)
Uncontrolled. D/c flonase, restart nasonex.

## 2016-04-26 NOTE — Assessment & Plan Note (Signed)
Wants to avoid long term topical steroid use. Will rx protopic.

## 2016-04-26 NOTE — Assessment & Plan Note (Signed)
Stable with improved diet, monitor.

## 2016-04-26 NOTE — Progress Notes (Signed)
Subjective:    Patient ID: Tiffany Hamilton, female    DOB: 10-02-1980, 36 y.o.   MRN: [id number]  HPI  Patient presents today for complete physical.  Immunizations: up to date Diet: healthy has recently improved Exercise: 3x a week goes to the gym Pap Smear: 2015  Allergic rhinitis- currently using flonase which is no longer effective. + sneezing, + rhinorrhea  Anxiety- notes that she finds herself anxious often. She has 3 step children (all teenagers). No family nearby. She quit her job a few years back to help out with the children. Feels overwhelmed at times.   Notes tha there HA's have worsened recently.  Imitrex helps.   No UTI with post coital macrobid. Review of Systems  Constitutional: Negative for unexpected weight change.  HENT: Positive for rhinorrhea. Negative for hearing loss.   Eyes: Negative for visual disturbance.  Respiratory: Negative for cough.        Reports ashtma is well controlled with once daily advair  Cardiovascular: Negative for leg swelling.  Gastrointestinal:       IBS has improved with change in diet, less diarrhea  Genitourinary: Negative for dysuria and frequency.  Musculoskeletal: Negative for arthralgias and myalgias.  Hematological: Negative for adenopathy.  Psychiatric/Behavioral:       Notes + anxiety. Denies depression   Past Medical History:  Diagnosis Date  . Allergy   . Asthma   . Cyst of left kidney   . Eczema   . Hypothyroidism   . IBS (irritable bowel syndrome)      Social History   Social History  . Marital status: Married    Spouse name: N/A  . Number of children: 0  . Years of education: N/A   Occupational History  . home maker    Social History Main Topics  . Smoking status: Former Smoker    Years: 10.00    Types: Cigarettes    Quit date: 02/28/2012  . Smokeless tobacco: Never Used     Comment: Previous tobacco use was occasional for 10 years.  . Alcohol use 0.0 oz/week     Comment: 1 beer and 1 glass of  wine each night.  . Drug use: No  . Sexual activity: Not on file   Other Topics Concern  . Not on file   Social History Narrative   Engineering geologist degree   3 step children   1 dog   1 cat   Enjoys shopping, cleaning      Past Surgical History:  Procedure Laterality Date  . ROBOTICALLY ASSISTED LAPAROSCOPIC URETERAL RE-IMPLANTATION Right 2005    Family History  Problem Relation Age of Onset  . Hypertension Mother   . Hypothyroidism Mother   . Hypothyroidism Father   . Asthma Father   . Pancreatic cancer Maternal Uncle   . Throat cancer Maternal Grandfather   . Lung cancer Paternal Grandmother   . Throat cancer Paternal Grandfather   . Other Brother     Brain Tumor    Allergies  Allergen Reactions  . Morphine And Related Hives  . Penicillins Hives  . Septra [Sulfamethoxazole-Trimethoprim] Hives    Current Outpatient Prescriptions on File Prior to Visit  Medication Sig Dispense Refill  . ADVAIR DISKUS 250-50 MCG/DOSE AEPB Use 1 inhalation two times  daily 180 each 0  . albuterol (PROVENTIL HFA;VENTOLIN HFA) 108 (90 BASE) MCG/ACT inhaler Inhale 2 puffs into the lungs every 6 (six) hours as needed for wheezing or shortness of  breath. 1 Inhaler 2  . levonorgestrel-ethinyl estradiol (JOLESSA) 0.15-0.03 MG tablet Take 1 tablet by mouth daily. 1 Package 3  . levothyroxine (SYNTHROID, LEVOTHROID) 112 MCG tablet Take 1 tablet by mouth  daily 90 tablet 1  . nitrofurantoin (MACRODANTIN) 50 MG capsule Take 1 capsule by mouth  after intercouse 30 capsule 1  . nitrofurantoin, macrocrystal-monohydrate, (MACROBID) 100 MG capsule Take 1 capsule (100 mg total) by mouth 2 (two) times daily. 14 capsule 0  . omeprazole (PRILOSEC) 20 MG capsule Take 1 capsule (20 mg total) by mouth daily. (Patient taking differently: Take 20 mg by mouth once a week. ) 90 capsule 3  . ondansetron (ZOFRAN) 4 MG tablet Take 1 tablet (4 mg total) by mouth every 8 (eight) hours as needed for  nausea or vomiting. 20 tablet 0  . SUMAtriptan (IMITREX) 50 MG tablet TAKE 1 TAB AT THE START OF MIGRAINE. MAY REPEAT 1 TAB IN 2 HOURS IF NEEDED (MAX 2 TABS/24 HOURS) 10 tablet 0   No current facility-administered medications on file prior to visit.     BP 129/87 (BP Location: Right Arm, Cuff Size: Normal)   Pulse 85   Temp 98.4 F (36.9 C) (Oral)   Resp 16   Ht 5\' 3"  (1.6 m)   Wt 105 lb 9.6 oz (47.9 kg)   SpO2 100% Comment: room air  BMI 18.71 kg/m        Objective:   Physical Exam  Physical Exam  Constitutional: She is oriented to person, place, and time. She appears well-developed and well-nourished. No distress.  HENT:  Head: Normocephalic and atraumatic.  Right Ear: Tympanic membrane and ear canal normal.  Left Ear: Tympanic membrane and ear canal normal.  Mouth/Throat: Oropharynx is clear and moist.  Eyes: Pupils are equal, round, and reactive to light. No scleral icterus.  Neck: Normal range of motion. No thyromegaly present.  Cardiovascular: Normal rate and regular rhythm.   No murmur heard. Pulmonary/Chest: Effort normal and breath sounds normal. No respiratory distress. He has no wheezes. She has no rales. She exhibits no tenderness.  Abdominal: Soft. Bowel sounds are normal. She exhibits no distension and no mass. There is no tenderness. There is no rebound and no guarding.  Musculoskeletal: She exhibits no edema.  Lymphadenopathy:    She has no cervical adenopathy.  Neurological: She is alert and oriented to person, place, and time. She has normal patellar reflexes. She exhibits normal muscle tone. Coordination normal.  Skin: Skin is warm and dry. + eczema noted on bilateral hands Psychiatric: She has a normal mood and anxious affect. Her behavior is normal. Judgment and thought content normal.  Breasts: Examined lying (bilateral nipples inverted) Right: Without masses, retractions, discharge or axillary adenopathy.  Left: Without masses, retractions, discharge  or axillary adenopathy. Pelvic: deferred         Assessment & Plan:         Assessment & Plan:

## 2016-04-26 NOTE — Progress Notes (Signed)
Pre visit review using our clinic review tool, if applicable. No additional management support is needed unless otherwise documented below in the visit note. 

## 2016-04-26 NOTE — Assessment & Plan Note (Signed)
Stable on synthroid, obtain follow up tsh.  

## 2016-04-26 NOTE — Assessment & Plan Note (Signed)
Stable with once daily advair

## 2016-04-27 ENCOUNTER — Other Ambulatory Visit: Payer: Self-pay | Admitting: Family

## 2016-04-28 ENCOUNTER — Other Ambulatory Visit: Payer: Self-pay | Admitting: Family

## 2016-04-28 MED ORDER — CRISABOROLE 2 % EX OINT: 1.0000 "application " | TOPICAL_OINTMENT | Freq: Two times a day (BID) | CUTANEOUS | 1 refills | Status: DC | PRN

## 2016-04-28 NOTE — Telephone Encounter (Signed)
Covermymeds has been updated and submitted. Awaiting determination. Message sent to pt re: status.

## 2016-04-28 NOTE — Telephone Encounter (Signed)
Received notice via covermymeds that Eucrisa will require PA. Form has been initiated and message has been sent to pt for alternatives that she has already tried and failed. Awaiting pt response.

## 2016-05-04 NOTE — Telephone Encounter (Signed)
Melissa-- per determination in covermymeds, Pam Drownucrisa has been denied. I am unsure what is preferred at this time.

## 2016-05-11 ENCOUNTER — Encounter: Payer: Self-pay | Admitting: Family Medicine

## 2016-05-11 ENCOUNTER — Encounter: Payer: Self-pay | Admitting: Family

## 2016-05-11 ENCOUNTER — Ambulatory Visit (INDEPENDENT_AMBULATORY_CARE_PROVIDER_SITE_OTHER): Payer: 59 | Admitting: Family Medicine

## 2016-05-11 VITALS — BP 118/82 | HR 89 | Temp 98.2°F | Resp 16 | Ht 63.0 in | Wt 107.0 lb

## 2016-05-11 DIAGNOSIS — R7989 Other specified abnormal findings of blood chemistry: Secondary | ICD-10-CM

## 2016-05-11 DIAGNOSIS — R319 Hematuria, unspecified: Secondary | ICD-10-CM

## 2016-05-11 DIAGNOSIS — Z9889 Other specified postprocedural states: Secondary | ICD-10-CM

## 2016-05-11 DIAGNOSIS — R1030 Lower abdominal pain, unspecified: Secondary | ICD-10-CM | POA: Diagnosis not present

## 2016-05-11 DIAGNOSIS — R102 Pelvic and perineal pain: Secondary | ICD-10-CM

## 2016-05-11 LAB — POC URINALSYSI DIPSTICK (AUTOMATED)
Bilirubin, UA: NEGATIVE
GLUCOSE UA: NEGATIVE
KETONES UA: NEGATIVE
Leukocytes, UA: NEGATIVE
Nitrite, UA: NEGATIVE
PROTEIN UA: NEGATIVE
SPEC GRAV UA: 1.01
UROBILINOGEN UA: 0.2
pH, UA: 5.5

## 2016-05-11 MED ORDER — CIPROFLOXACIN HCL 500 MG PO TABS: 500.0000 mg | ORAL_TABLET | Freq: Two times a day (BID) | ORAL | 0 refills | Status: DC

## 2016-05-11 NOTE — Telephone Encounter (Signed)
Could you please contact pt to see if she may need to be brought in today for further evaluation.

## 2016-05-11 NOTE — Patient Instructions (Signed)
We will treat as possible urinary tract infection and send your urine for culture.  We will call you in a few days with urine results.  Monitor for possible kidney stone (will burn-sometimes- if you pass it) Try aleve twice a day for a few days in a row (if able to take).  Plenty of water, flush your kidneys.   If you are worsening before you hear from us, please be seen immediately in ED and/or call in to us and I will order image study.     Hematuria, Adult Hematuria is blood in your urine. It can be caused by a bladder infection, kidney infection, prostate infection, kidney stone, or cancer of your urinary tract. Infections can usually be treated with medicine, and a kidney stone usually will pass through your urine. If neither of these is the cause of your hematuria, further workup to find out the reason may be needed. It is very important that you tell your health care provider about any blood you see in your urine, even if the blood stops without treatment or happens without causing pain. Blood in your urine that happens and then stops and then happens again can be a symptom of a very serious condition. Also, pain is not a symptom in the initial stages of many urinary cancers. Follow these instructions at home:  Drink lots of fluid, 3-4 quarts a day. If you have been diagnosed with an infection, cranberry juice is especially recommended, in addition to large amounts of water.  Avoid caffeine, tea, and carbonated beverages because they tend to irritate the bladder.  Avoid alcohol because it may irritate the prostate.  Take all medicines as directed by your health care provider.  If you were prescribed an antibiotic medicine, finish it all even if you start to feel better.  If you have been diagnosed with a kidney stone, follow your health care provider's instructions regarding straining your urine to catch the stone.  Empty your bladder often. Avoid holding urine for long periods of  time.  After a bowel movement, women should cleanse front to back. Use each tissue only once.  Empty your bladder before and after sexual intercourse if you are a female. Contact a health care provider if:  You develop back pain.  You have a fever.  You have a feeling of sickness in your stomach (nausea) or vomiting.  Your symptoms are not better in 3 days. Return sooner if you are getting worse. Get help right away if:  You develop severe vomiting and are unable to keep the medicine down.  You develop severe back or abdominal pain despite taking your medicines.  You begin passing a large amount of blood or clots in your urine.  You feel extremely weak or faint, or you pass out. This information is not intended to replace advice given to you by your health care provider. Make sure you discuss any questions you have with your health care provider. Document Released: 04/05/2005 Document Revised: 09/11/2015 Document Reviewed: 12/04/2012 Elsevier Interactive Patient Education  2017 [employer]

## 2016-05-11 NOTE — Progress Notes (Signed)
Tiffany Hamilton , Nov 12, 1980, 36 y.o., female MRN: [id number] Patient Care Team    Relationship Specialty Notifications Start End  Sandford Craze, NP PCP - General Internal Medicine  03/11/14   Napoleon Form, MD Consulting Physician Gastroenterology  04/22/15     CC: abd pain Subjective: Pt presents for an acute OV with complaints of abdominal pain of 3 days duration.  Associated symptoms include bloating, lower midline pelvic pain. She reports a small amount of diarrhea the first day, but she has IBS that is not abnormal for her. The stomach pain is constant and located in her suprapubic area. She is prescribed macrodantin for frequent UTI, secondary abnl anatomy/collecting system. 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 She has a known stable kidney cyst. She has no known kidney stone history. She is allergic to bactrim and PCN. She is eating, drinking and urinating her normal. She denies fever, chills, nausea or low back pain.  Allergies  Allergen Reactions  . Morphine And Related Hives  . Penicillins Hives  . Septra [Sulfamethoxazole-Trimethoprim] Hives   Social History  Substance Use Topics  . Smoking status: Former Smoker    Years: 10.00    Types: Cigarettes    Quit date: 02/28/2012  . Smokeless tobacco: Never Used     Comment: Previous tobacco use was occasional for 10 years.  . Alcohol use 0.0 oz/week     Comment: twice a week has 2 drinks   Past Medical History:  Diagnosis Date  . Allergy   . Asthma   . Cyst of left kidney   . Eczema   . Hypothyroidism   . IBS (irritable bowel syndrome)    Past Surgical History:  Procedure Laterality Date  . ROBOTICALLY ASSISTED LAPAROSCOPIC URETERAL RE-IMPLANTATION Right 2005   Family History  Problem Relation Age of Onset  . Hypertension Mother   . Hypothyroidism Mother   . Hypothyroidism Father   . Asthma Father   . Pancreatic cancer Maternal Uncle   .  Throat cancer Maternal Grandfather   . Lung cancer Paternal Grandmother   . Throat cancer Paternal Grandfather   . Other Brother     Brain Tumor   Allergies as of 05/11/2016      Reactions   Morphine And Related Hives   Penicillins Hives   Septra [sulfamethoxazole-trimethoprim] Hives      Medication List       Accurate as of 05/11/16  4:19 PM. Always use your most recent med list.          ADVAIR DISKUS 250-50 MCG/DOSE Aepb Generic drug:  Fluticasone-Salmeterol Use 1 inhalation two times  daily   albuterol 108 (90 Base) MCG/ACT inhaler Commonly known as:  PROVENTIL HFA;VENTOLIN HFA Inhale 2 puffs into the lungs every 6 (six) hours as needed for wheezing or shortness of breath.   Crisaborole 2 % Oint Commonly known as:  EUCRISA Apply 1 application topically 2 (two) times daily as needed.   JUBLIA 10 % Soln Generic drug:  Efinaconazole Apply topically. Apply once daily to affected toenail.   levonorgestrel-ethinyl estradiol 0.15-0.03 MG tablet Commonly known as:  JOLESSA Take 1 tablet by mouth daily.   levothyroxine 112 MCG tablet Commonly known as:  SYNTHROID, LEVOTHROID TAKE 1 TABLET BY MOUTH  DAILY   mometasone 50 MCG/ACT nasal spray Commonly known as:  NASONEX USE 2 SPRAYS NASALLY DAILY   nitrofurantoin (macrocrystal-monohydrate) 100 MG capsule Commonly known as:  MACROBID Take  1 capsule (100 mg total) by mouth 2 (two) times daily.   nitrofurantoin 50 MG capsule Commonly known as:  MACRODANTIN TAKE 1 CAPSULE BY MOUTH  AFTER INTERCOUSE   omeprazole 20 MG capsule Commonly known as:  PRILOSEC Take 1 capsule (20 mg total) by mouth daily.   ondansetron 4 MG tablet Commonly known as:  ZOFRAN Take 1 tablet (4 mg total) by mouth every 8 (eight) hours as needed for nausea or vomiting.   SUMAtriptan 50 MG tablet Commonly known as:  IMITREX TAKE 1 TAB AT THE START OF MIGRAINE. MAY REPEAT 1 TAB IN 2 HOURS IF NEEDED (MAX 2 TABS/24 HOURS)       Results for  orders placed or performed in visit on 05/11/16 (from the past 24 hour(s))  POCT Urinalysis Dipstick (Automated)     Status: None   Collection Time: 05/11/16  4:12 PM  Result Value Ref Range   Color, UA yellow    Clarity, UA clear    Glucose, UA negative    Bilirubin, UA negative    Ketones, UA negative    Spec Grav, UA 1.010    Blood, UA moderate    pH, UA 5.5    Protein, UA negative    Urobilinogen, UA 0.2    Nitrite, UA negative    Leukocytes, UA Negative Negative   No results found.   ROS: Negative, with the exception of above mentioned in HPI   Objective:  BP 118/82 (BP Location: Right Arm, Patient Position: Sitting, Cuff Size: Normal)   Pulse 89   Temp 98.2 F (36.8 C) (Oral)   Resp 16   Ht 5\' 3"  (1.6 m)   Wt 107 lb (48.5 kg)   BMI 18.95 kg/m  Body mass index is 18.95 kg/m. Gen: Afebrile. No acute distress. Nontoxic in appearance, well developed, well nourished. Pleasant, thin female.  HENT: AT. [place]. MMM, no oral lesions.  Eyes:Pupils Equal Round Reactive to light, Extraocular movements intact,  Conjunctiva without redness, discharge or icterus. CV: RRR, no edema Chest: CTAB, no wheeze or crackles.  Abd: Soft. flat. TTP deep suprapubic area. ND. BS mildly overactive. no Masses palpated. No rebound or guarding. Neg obturator, Mcburney tenderness or psoas sign. Neuro: Normal gait. PERLA. EOMi. Alert. Oriented x3   Results for orders placed or performed in visit on 05/11/16 (from the past 24 hour(s))  POCT Urinalysis Dipstick (Automated)     Status: None   Collection Time: 05/11/16  4:12 PM  Result Value Ref Range   Color, UA yellow    Clarity, UA clear    Glucose, UA negative    Bilirubin, UA negative    Ketones, UA negative    Spec Grav, UA 1.010    Blood, UA moderate    pH, UA 5.5    Protein, UA negative    Urobilinogen, UA 0.2    Nitrite, UA negative    Leukocytes, UA Negative Negative     Assessment/Plan: Tiffany Hamilton is a 36 y.o. female present  for acute OV for  Lower abdominal pain/hematuria - Positive suprapubic area and hematuria. Cipro prescribed. Kidney stone precautions discussed. Naproxen use if able to tolerate. Increase water consumption. Pt was advised if pain is worsening she is to be seen immediately in ED if after hours. If she is not improving with abx she is to call in and I would favor [place] abd/pelvis/kidney given her histroy. She was tender on exam, but not profoundly to feel imagining is urgent. If not  improving on call back for urine culture would order US.  - POCT Urinalysis Dipstick (Automated) - Urine Culture - Urinalysis, Routine w reflex microscopic - F/U PRN, sooner if worsening.    > 25 minutes spent with patient, >50% of time spent face to face counseling.   electronically signed by:  Felix Pacinienee Kuneff, DO  [facility] [place]

## 2016-05-11 NOTE — Telephone Encounter (Signed)
Called to follow up with patient.  C/o: lower abdominal pain that started on Sunday and has worsen over time.  Described as a constant "burning" pain.  Rated 6/10.  No accompanying symptoms.  Denies chest pain, shortness of breath, fever, chills, malaise.  Last BM:  This morning.  Pt states stool was normal.  No pain or blood noted.  Pt states she's had UTIs before in the past, but this feels different.  She has tried to self-treat with Ibuprofen and Imodium.  Neither has helped.  Given patient's report, she was advised to be seen.  Appt scheduled with Dr[facility] Patsi SearsKuenuff at [facility] at 4 pm.  She was advised if symptoms worsen to go to [facility] or ER for evaluation.  She stated understanding and agreed to comply.

## 2016-05-11 NOTE — Progress Notes (Signed)
Pre visit review using our clinic review tool, if applicable. No additional management support is needed unless otherwise documented below in the visit note. 

## 2016-05-12 ENCOUNTER — Telehealth: Payer: Self-pay | Admitting: Family Medicine

## 2016-05-12 DIAGNOSIS — R1030 Lower abdominal pain, unspecified: Secondary | ICD-10-CM

## 2016-05-12 DIAGNOSIS — N281 Cyst of kidney, acquired: Secondary | ICD-10-CM

## 2016-05-12 DIAGNOSIS — R319 Hematuria, unspecified: Secondary | ICD-10-CM

## 2016-05-12 DIAGNOSIS — Q625 Duplication of ureter: Secondary | ICD-10-CM

## 2016-05-12 DIAGNOSIS — N3001 Acute cystitis with hematuria: Secondary | ICD-10-CM

## 2016-05-12 DIAGNOSIS — N1370 Vesicoureteral-reflux, unspecified: Secondary | ICD-10-CM

## 2016-05-12 LAB — URINALYSIS, ROUTINE W REFLEX MICROSCOPIC
Bilirubin Urine: NEGATIVE
Glucose, UA: NEGATIVE
KETONES UR: NEGATIVE
LEUKOCYTES UA: NEGATIVE
Nitrite: NEGATIVE
PROTEIN: NEGATIVE
Specific Gravity, Urine: 1.011 (ref 1.001–1.035)
pH: 6 (ref 5.0–8.0)

## 2016-05-12 LAB — URINALYSIS, MICROSCOPIC ONLY
Bacteria, UA: NONE SEEN [HPF]
Casts: NONE SEEN [LPF]
Crystals: NONE SEEN [HPF]
RBC / HPF: NONE SEEN RBC/HPF (ref <=2)
SQUAMOUS EPITHELIAL / LPF: NONE SEEN [HPF] (ref <=5)
WBC UA: NONE SEEN WBC/HPF (ref <=5)
Yeast: NONE SEEN [HPF]

## 2016-05-12 LAB — URINE CULTURE: Organism ID, Bacteria: NO GROWTH

## 2016-05-12 MED ORDER — FLUCONAZOLE 150 MG PO TABS: 150.0000 mg | ORAL_TABLET | Freq: Once | ORAL | 0 refills | Status: AC

## 2016-05-12 NOTE — Telephone Encounter (Signed)
Patient notified and verbalized understanding. 

## 2016-05-12 NOTE — Telephone Encounter (Signed)
Patient notified and verbalized understanding.  She did ask if she could wait until finishing the antibiotic to see if symptoms go away. Patient instructed to schedule the u/s a week out and to cancel if symptoms better but if she notices blood to go to ER per Dr. Claiborne BillingsKuneff.

## 2016-05-12 NOTE — Telephone Encounter (Signed)
No Problem. Prescription called in.

## 2016-05-12 NOTE — Telephone Encounter (Signed)
Patient forgot to tell Dr Claiborne BillingsKuneff that she is prone to getting a yeast infection after taking antibiotics.  Can she please sent RX [facility] [address]?  Please advise.

## 2016-05-12 NOTE — Telephone Encounter (Signed)
Please call pt: - her microscopic urine resulted with moderate blood, no white cells, consistent with the POCT yesterday. It is sent for culture and we will have that back in a few days. However given her history, I would like to move forward with US of her urinary tract/kidneys. I have placed this order for her - She mentioned [place] was closer for her than [facility], so I placed it for [facility] image [address]. They will call her to schedule and she will stll hear from us again when her culture returns.

## 2016-05-13 ENCOUNTER — Telehealth: Payer: Self-pay | Admitting: Family Medicine

## 2016-05-13 NOTE — Telephone Encounter (Signed)
Spoke with patient reviewed results and instructions . Patient verbalized understanding. 

## 2016-05-13 NOTE — Telephone Encounter (Signed)
Left message for patient to return call on voicemail. 

## 2016-05-13 NOTE — Telephone Encounter (Signed)
Please call patient: Her urinalysis did not grow any bacteria, therefore she did not have a urinary tract infection. She was provided with Cipro, I would suggest if her symptoms are improving to complete the Cipro course. If she is still having discomfort I would suggest going ahead with the ultrasound as discussed, and the order had been placed for her. If pain persists, and she does end up having the ultrasound completed, I would like to see her 2 days after the ultrasound to review results and reevaluate her abdominal pain.

## 2016-05-15 ENCOUNTER — Encounter: Payer: Self-pay | Admitting: Family Medicine

## 2016-05-15 ENCOUNTER — Ambulatory Visit (HOSPITAL_BASED_OUTPATIENT_CLINIC_OR_DEPARTMENT_OTHER)
Admission: RE | Admit: 2016-05-15 | Discharge: 2016-05-15 | Disposition: A | Discharge status: Home / Self Care | Payer: 59 | Source: Ambulatory Visit | Attending: Family Medicine | Admitting: Family Medicine

## 2016-05-15 DIAGNOSIS — N281 Cyst of kidney, acquired: Secondary | ICD-10-CM | POA: Insufficient documentation

## 2016-05-15 DIAGNOSIS — N1370 Vesicoureteral-reflux, unspecified: Secondary | ICD-10-CM | POA: Diagnosis present

## 2016-05-15 DIAGNOSIS — R1030 Lower abdominal pain, unspecified: Secondary | ICD-10-CM | POA: Diagnosis not present

## 2016-05-15 DIAGNOSIS — R319 Hematuria, unspecified: Secondary | ICD-10-CM

## 2016-05-15 DIAGNOSIS — N271 Small kidney, bilateral: Secondary | ICD-10-CM | POA: Insufficient documentation

## 2016-05-17 ENCOUNTER — Telehealth: Payer: Self-pay | Admitting: Family Medicine

## 2016-05-17 ENCOUNTER — Telehealth: Payer: Self-pay | Admitting: *Deleted

## 2016-05-17 ENCOUNTER — Ambulatory Visit (INDEPENDENT_AMBULATORY_CARE_PROVIDER_SITE_OTHER): Payer: 59 | Admitting: Family Medicine

## 2016-05-17 ENCOUNTER — Encounter: Payer: Self-pay | Admitting: Family Medicine

## 2016-05-17 ENCOUNTER — Telehealth: Payer: Self-pay | Admitting: Family

## 2016-05-17 VITALS — BP 110/80 | HR 68 | Resp 12 | Ht 63.0 in | Wt 106.4 lb

## 2016-05-17 DIAGNOSIS — N1370 Vesicoureteral-reflux, unspecified: Secondary | ICD-10-CM

## 2016-05-17 DIAGNOSIS — R109 Unspecified abdominal pain: Secondary | ICD-10-CM | POA: Diagnosis not present

## 2016-05-17 DIAGNOSIS — K589 Irritable bowel syndrome without diarrhea: Secondary | ICD-10-CM | POA: Diagnosis not present

## 2016-05-17 LAB — BASIC METABOLIC PANEL
BUN: 17 mg/dL (ref 6–23)
CALCIUM: 9.2 mg/dL (ref 8.4–10.5)
CO2: 22 mEq/L (ref 19–32)
CREATININE: 1.28 mg/dL — AB (ref 0.40–1.20)
Chloride: 104 mEq/L (ref 96–112)
GFR: 50.53 mL/min — ABNORMAL LOW (ref >60.00)
Glucose, Bld: 74 mg/dL (ref 70–99)
Potassium: 4.2 mEq/L (ref 3.5–5.1)
Sodium: 136 mEq/L (ref 135–145)

## 2016-05-17 LAB — POCT URINALYSIS DIPSTICK
BILIRUBIN UA: NEGATIVE
Glucose, UA: NEGATIVE
KETONES UA: NEGATIVE
Leukocytes, UA: NEGATIVE
Nitrite, UA: NEGATIVE
PH UA: 6
PROTEIN UA: NEGATIVE
RBC UA: NEGATIVE
SPEC GRAV UA: 1.02
Urobilinogen, UA: 0.2

## 2016-05-17 LAB — CBC
HEMATOCRIT: 39.1 % (ref 36.0–46.0)
Hemoglobin: 13.3 g/dL (ref 12.0–15.0)
MCHC: 34 g/dL (ref 30.0–36.0)
MCV: 92.4 fl (ref 78.0–100.0)
Platelets: 280 10*3/uL (ref 150.0–400.0)
RBC: 4.24 Mil/uL (ref 3.87–5.11)
RDW: 12.6 % (ref 11.5–15.5)
WBC: 9 10*3/uL (ref 4.0–10.5)

## 2016-05-17 LAB — POCT URINE PREGNANCY: Preg Test, Ur: NEGATIVE

## 2016-05-17 NOTE — Patient Instructions (Addendum)
  [name] I have seen you today for an acute visit.  A few things to remember from today's visit:   Flank pain - Plan: POCT urinalysis dipstick, Basic metabolic panel, CBC  Irritable bowel syndrome, unspecified type      Monitor for rash on affected area.  ? Muscular, ? IBS exacerbation.  Caution with Ibuprofen or Aleve.  Recent U/S shoed: Small kidneys bilaterally ,chronic medical renal disease. No hydronephrosis.  Medications prescribed today are intended for short period of time and will not be refill upon request, a follow up appointment might be necessary to discuss continuation of of treatment if appropriate.     In general please monitor for signs of worsening symptoms and seek immediate medical attention if any concerning.   Please be sure you have an appointment already scheduled with your PCP before you leave today.

## 2016-05-17 NOTE — Progress Notes (Signed)
HPI:   Tiffany Hamilton is a 36 y.o. female, who is here today complaining of intermittent right low back/flank pain.  Localization:right flank Quality:Burning Severity:4/10 Duration:Since 05/08/16.  Symptoms started with periumbilical abdominal pain and bloating sensation, radiated to lower back; then she noted burning sensation on right low back, non-radiated, she has not identified exacerbating or alleviating factors. Abdominal pain and bloating sensation had improved Pain lasts a few hours. She has history of recurrent UTI, urethral reflux with renal scarring changes. She is no longer following with urologist, she takes Nitrofurantoin 50 mg after sex intercourse as needed for UTI prevention.  She has not noted dysuria, gross hematuria, frequency, or urgency. She has not noted fever, nausea, vomiting, or body aches. Last night she had some chills. She has not noted any skin changes, tingling, or rash on affected area. She denies history of nephrolithiasis.  05/11/16 Ucx: No growth.  She is sexually active and has not noted abnormal vaginal discharge or vaginal spotting.  LMP a month ago, she is on OCP's.  Current treatment: Ibuprofen , last taken yesterday. She just completed treatment with Cipro.  She had labs done a couple weeks ago for her CPE.   Lab Results  Component Value Date   CREATININE 0.85 04/26/2016   BUN 13 04/26/2016   NA 136 04/26/2016   K 4.2 04/26/2016   CL 101 04/26/2016   CO2 28 04/26/2016   Lab Results  Component Value Date   WBC 5.9 04/26/2016   HGB 14.8 04/26/2016   HCT 42.9 04/26/2016   MCV 91.2 04/26/2016   PLT 276.0 04/26/2016   Lab Results  Component Value Date   TSH 1.72 04/26/2016   She also has history of IBS-D, she tells me that for the past 2 weeks she's been trying to eat "better", so she has noted improvement in diarrhea. She denies any mucus or blood in stools.  In general no changes in appetite, she also mentions  metal taste in mouth.  [facility], renal ultrasound was done.  Renal U/S 05/16/16: Small kidneys bilaterally with increased echotexture suggesting chronic medical renal disease. No hydronephrosis.  She has Hx of lower back pain, lumbar X ray 01/2015, negative otherwise.    Review of Systems  Constitutional: Positive for chills and fatigue. Negative for activity change, fever and unexpected weight change.  HENT: Negative for mouth sores, sore throat and trouble swallowing.   Respiratory: Negative for cough, shortness of breath and wheezing.   Cardiovascular: Negative for leg swelling.  Gastrointestinal: Negative for abdominal pain, blood in stool, nausea and vomiting.       Negative for changes in bowel habits or fecal incontinence.  Genitourinary: Negative for decreased urine volume, dysuria, hematuria, vaginal bleeding and vaginal discharge.       Negative for urine incontinence.  Musculoskeletal: Positive for back pain. Negative for joint swelling and myalgias.  Skin: Negative for color change and rash.  Neurological: Negative for syncope, weakness, numbness and headaches.  Hematological: Negative for adenopathy. Does not bruise/bleed easily.  Psychiatric/Behavioral: Negative for confusion. The patient is nervous/anxious.       Current Outpatient Prescriptions on File Prior to Visit  Medication Sig Dispense Refill  . ADVAIR DISKUS 250-50 MCG/DOSE AEPB Use 1 inhalation two times  daily 180 each 0  . albuterol (PROVENTIL HFA;VENTOLIN HFA) 108 (90 BASE) MCG/ACT inhaler Inhale 2 puffs into the lungs every 6 (six) hours as needed for wheezing or  shortness of breath. 1 Inhaler 2  . Crisaborole (EUCRISA) 2 % OINT Apply 1 application topically 2 (two) times daily as needed. 60 g 1  . Efinaconazole (JUBLIA) 10 % SOLN Apply topically. Apply once daily to affected toenail.    Marland Kitchen levonorgestrel-ethinyl estradiol (JOLESSA) 0.15-0.03 MG tablet Take 1 tablet by  mouth daily. 1 Package 3  . levothyroxine (SYNTHROID, LEVOTHROID) 112 MCG tablet TAKE 1 TABLET BY MOUTH  DAILY 90 tablet 1  . mometasone (NASONEX) 50 MCG/ACT nasal spray USE 2 SPRAYS NASALLY DAILY 51 g 1  . omeprazole (PRILOSEC) 20 MG capsule Take 1 capsule (20 mg total) by mouth daily. (Patient taking differently: Take 20 mg by mouth once a week. ) 90 capsule 3  . ondansetron (ZOFRAN) 4 MG tablet Take 1 tablet (4 mg total) by mouth every 8 (eight) hours as needed for nausea or vomiting. 20 tablet 0  . SUMAtriptan (IMITREX) 50 MG tablet TAKE 1 TAB AT THE START OF MIGRAINE. MAY REPEAT 1 TAB IN 2 HOURS IF NEEDED (MAX 2 TABS/24 HOURS) 10 tablet 0   No current facility-administered medications on file prior to visit.      Past Medical History:  Diagnosis Date  . Allergy   . Asthma   . Cyst of left kidney   . Eczema   . Hypothyroidism   . IBS (irritable bowel syndrome)    Allergies  Allergen Reactions  . Morphine And Related Hives  . Penicillins Hives  . Septra [Sulfamethoxazole-Trimethoprim] Hives    Social History   Social History  . Marital status: Married    Spouse name: N/A  . Number of children: 0  . Years of education: N/A   Occupational History  . home maker    Social History Main Topics  . Smoking status: Former Smoker    Years: 10.00    Types: Cigarettes    Quit date: 02/28/2012  . Smokeless tobacco: Never Used     Comment: Previous tobacco use was occasional for 10 years.  . Alcohol use 0.0 oz/week     Comment: twice a week has 2 drinks  . Drug use: No  . Sexual activity: Yes    Birth control/ protection: Pill   Other Topics Concern  . None   Social History Narrative   Quit working 5/16    Bachelors degree   3 step children   1 dog   1 cat   Enjoys shopping, cleaning      Vitals:   05/17/16 1137  BP: 110/80  Pulse: 68  Resp: 12   O2 sat 99%.  Body mass index is 18.84 kg/m.   Physical Exam  Nursing note and vitals  reviewed. Constitutional: She is oriented to person, place, and time. She appears well-developed. She does not appear ill. No distress.  HENT:  Head: Atraumatic.  Mouth/Throat: Uvula is midline. Mucous membranes are dry (mild). No oropharyngeal exudate or posterior oropharyngeal erythema.  Eyes: Conjunctivae and EOM are normal.  Cardiovascular: Normal rate and regular rhythm.   Respiratory: Effort normal and breath sounds normal. No respiratory distress.  GI: Soft. Bowel sounds are normal. She exhibits no mass. There is no hepatomegaly. There is no tenderness. There is no CVA tenderness.  Musculoskeletal: She exhibits no edema or tenderness.  No significant deformity appreciated. No tenderness upon palpation of paraspinal muscles. Pain is not elicited with movement on exam table during examination. No local edema or erythema appreciated, no suspicious lesions. Hips with normal ROM, no pain  elicited.  Lymphadenopathy:    She has no cervical adenopathy.  Neurological: She is alert and oriented to person, place, and time. She has normal strength. Coordination and gait normal.  SLR negative bilateral.  Skin: Skin is warm. No rash noted. No erythema.  Psychiatric: Her mood appears anxious.  Well groomed, good eye contact.      ASSESSMENT AND PLAN:   Vincent was seen today for flank pain.  Diagnoses and all orders for this visit:   Flank pain  Possible etiologies, she has Hx of back pain, so it can also be musculoskeletal. Urine disp stick today negative.  She has been treated empirically for UTI, recently Ucx negative, and she is not having urinary symptoms. Because of her history of scarring renal changes I advise caution with NSAIDs, she could take acetaminophen if needed. Clearly instructed about warning signs. She will monitor for rash or color changes on affected area. Recommended following with PCP in 2 weeks.   -     POCT urinalysis dipstick -     Basic metabolic  panel  -     POCT urine pregnancy -     CBC  Irritable bowel syndrome, unspecified type  Abdominal discomfort she is reporting can certainly be related to IBS. Reporting improvement. Continue dietary management. F/U as needed.  Ureteral reflux  Recent renal ultrasound negative for hydronephrosis. Continue nitrofurantoin as needed. Adequate hydration. Continue following with her PCP. Further recommendations would be given according to lab results.  -     Basic metabolic panel     -Ms.Camella Seim as  advised to return or notify a doctor immediately if symptoms worsen or new concerns arise, otherwise she will follow with PCP as instructed.       Reg Bircher G. [place], MD  [facility]. [place] office.

## 2016-05-17 NOTE — Telephone Encounter (Signed)
Spoke with patient reviewed US results and instructions. Patient states she saw Dr SwazilandJordan today and ghhad labs drawn and is waiting for the results. She will follow up with her PCP.

## 2016-05-17 NOTE — Telephone Encounter (Signed)
Patient Name: Tiffany SchoolsJAMIE Pennel  DOB: 04-22-1980    Initial Comment Caller states she's having severe abdominal pain and back pain.   Nurse Assessment  Nurse: Stefano GaulStringer, RN, Dwana CurdVera Date/Time (Eastern Time): 05/17/2016 8:59:26 AM  Confirm and document reason for call. If symptomatic, describe symptoms. ---Caller states she had severe flank pain last night and it was radiating to her back. She finished antibiotics for blood in her urine. Had ultrasound of her kidneys on Saturday. Having flank pain on the right side. No blood in urine. No fever. Has not heard the results of her ultrasound. Had metallic taste in her mouth last night.  Does the patient have any new or worsening symptoms? ---Yes  Will a triage be completed? ---Yes  Related visit to physician within the last 2 weeks? ---Yes  Does the PT have any chronic conditions? (i.e. diabetes, asthma, etc.) ---No  Is the patient pregnant or possibly pregnant? (Ask all females between the ages of 512-55) ---No  Is this a behavioral health or substance abuse call? ---No     Guidelines    Guideline Title Affirmed Question Affirmed Notes  Flank Pain MODERATE pain (e.g., interferes with normal activities or awakens from sleep)    Final Disposition User   See Physician within 24 Hours [facility] IgnaceStringer, Charity fundraiserN, Vera    Comments  [facility]. Appt scheduled for 05/17/2016 at 11:15 am with Dr. Betty SwazilandJordan   Referrals  REFERRED TO PCP OFFICE   Disagree/Comply: Comply

## 2016-05-17 NOTE — Telephone Encounter (Signed)
See [facility] phone note from today and pt emails from the weekend.

## 2016-05-17 NOTE — Telephone Encounter (Signed)
PLEASE NOTE: All timestamps contained within this report are represented as [place] Standard Time. CONFIDENTIALTY NOTICE: This fax transmission is intended only for the addressee. It contains information that is legally privileged, confidential or otherwise protected from use or disclosure. If you are not the intended recipient, you are strictly prohibited from reviewing, disclosing, copying using or disseminating any of this information or taking any action in reliance on or regarding this information. If you have received this fax in error, please notify us immediately by telephone so that we can arrange for its return to us. Phone: [phone], Toll-Free: [phone], Fax: [phone] Page: 1 of 2 Call Id: [id number] [facility] - Client TELEPHONE ADVICE RECORD [facility] Patient Name: Tiffany Hamilton Gender: Female DOB: [birth year] Age: [age] Y 8 M 5 D Return Phone Number: [phone] (Primary) [place] [id number] Client [facility] - Client Client Site [facility] Physician Felix PaciniKuneff, Renee Who Is Calling Patient / Member / Family / Caregiver Call Type Triage / Clinical Relationship To Patient Self Return Phone Number [phone] (Primary) Chief Complaint Back Pain - General Reason for Call Symptomatic / Request for Health Information Initial Comment Caller stated was seen earlier this week and had an ultrasound done yesterday. Was put on antibiotic but is not feeling better. Stated is having pain in kidneys, loss of appetite, and extreme thirst. CB: No change in symptoms but wondering why she hasn't had a call back yet. Nurse Assessment Nurse: Lucretia FieldGrossman, RN, Santina Evansatherine Date/Time (Eastern Time): 05/16/2016 8:27:32 PM Confirm and document reason for call. If symptomatic, describe symptoms. ---Caller stated was seen earlier this week and had an ultrasound done yesterday. Was  put on antibiotic but is not feeling better. Stated is having pain in kidneys, loss of appetite, and extreme thirst. Ultrasound was done yesterday but she doesn't have the results. Urine sample done- blood in urine. Not bacterial. She has been on cipro but still having symptoms. Does the PT have any chronic conditions? (i.e. diabetes, asthma, etc.) ---Yes List chronic conditions. ---asthma, allergies, surgery on right ureter for reflux of bladder. Scarring on kidneys and a cyst on ureter. Is the patient pregnant or possibly pregnant? (Ask all females between the ages of 712-55) ---No Guidelines Guideline Title Affirmed Question Flank Pain MODERATE pain (e.g., interferes with normal activities or awakens from sleep) Disp. Time Lamount Cohen(Eastern Time) Disposition Final User 05/16/2016 8:36:53 PM See Physician within 24 Hours Yes Lucretia FieldGrossman, RN, Santina Evansatherine Referrals REFERRED TO PCP OFFICE Care Advice Given Per Guideline SEE PHYSICIAN WITHIN 24 HOURS: * IF OFFICE WILL BE OPEN: You need to be seen within the next 24 hours. Call your doctor when the office opens, and make an appointment. CALL BACK IF: * Fever over 100.5 F (38.1 C) * You become worse. CARE ADVICE given per Flank Pain (Adult) guideline. PLEASE NOTE: All timestamps contained within this report are represented as [place] Standard Time. CONFIDENTIALTY NOTICE: This fax transmission is intended only for the addressee. It contains information that is legally privileged, confidential or otherwise protected from use or disclosure. If you are not the intended recipient, you are strictly prohibited from reviewing, disclosing, copying using or disseminating any of this information or taking any action in reliance on or regarding this information. If you have received this fax in error, please notify us immediately by telephone so that we can arrange for its return to us. Phone: [phone], Toll-Free: [phone], Fax: [phone] Page: 2 of  2 Call  Id: [id number] Comments User: Candace Gallus Date/Time Lamount Cohen Time): 05/16/2016 8:20:51 PM CB: No change in symptoms but wondering why she hasn't had a call back yet.

## 2016-05-17 NOTE — Progress Notes (Signed)
Pre visit review using our clinic review tool, if applicable. No additional management support is needed unless otherwise documented below in the visit note. 

## 2016-05-17 NOTE — Telephone Encounter (Signed)
Patient called requesting an appt with Sandford CrazeMelissa O'Sullivan today, however Melissa's schedule is full for today 05/17/16. She stated that she saw another doctor last week for a kidney problem but she is still having the pain and needed to follow up with her PCP. I offered her an appt with Melissa 05/18/16, she refused stating that she was in severe pain last night in her abdomin and back. Sent to [facility] to be triaged for severe abdominal pain.

## 2016-05-17 NOTE — Telephone Encounter (Signed)
Noted  

## 2016-05-17 NOTE — Telephone Encounter (Signed)
Please call patient, her renal ultrasound did not have any acute problems or concerns. If she continues to have lower abdominal discomfort, I would suggest she see her PCP (or gyn) for follow-up.

## 2016-05-18 ENCOUNTER — Encounter: Payer: Self-pay | Admitting: Family Medicine

## 2016-05-20 ENCOUNTER — Telehealth: Payer: Self-pay | Admitting: Family

## 2016-05-20 MED ORDER — OSELTAMIVIR PHOSPHATE 30 MG PO CAPS: 30.0000 mg | ORAL_CAPSULE | Freq: Two times a day (BID) | ORAL | 0 refills | Status: DC

## 2016-05-20 NOTE — Telephone Encounter (Signed)
Pt is concerned about flu- would like tamiflu. With most recent labs her creat clearance is approx 46- will reduce dose of tamiflu to 30 BID for 5 days Ok to use tylenol products but avoid NSAIDs Please advise her

## 2016-05-20 NOTE — Telephone Encounter (Signed)
Patient stated she had blood in her urine a few weeks ago. She stated she has had an US on her kidneys and also no UTI. She has appt to Urologist, and Nephrology. Patient states she has had cold chills, body aches, congestion, dry cough, she also states she is weak. Drinking plenty of fluids have not eating today.  PC

## 2016-05-20 NOTE — Telephone Encounter (Signed)
Pt called in she says that she has a kidney issue and is unable to take anti-inflammatory medications. Pt says that she feels like she is getting sick, congestion, cough.   She would like a call back to be advised on what she could possibly do to help with her new symptoms.

## 2016-05-21 ENCOUNTER — Ambulatory Visit (HOSPITAL_BASED_OUTPATIENT_CLINIC_OR_DEPARTMENT_OTHER)
Admission: RE | Admit: 2016-05-21 | Discharge: 2016-05-21 | Disposition: A | Discharge status: Home / Self Care | Payer: 59 | Source: Ambulatory Visit | Attending: Family | Admitting: Family

## 2016-05-21 ENCOUNTER — Telehealth: Payer: Self-pay | Admitting: Family

## 2016-05-21 DIAGNOSIS — R102 Pelvic and perineal pain: Secondary | ICD-10-CM | POA: Diagnosis not present

## 2016-05-21 NOTE — Telephone Encounter (Signed)
Ovaries and uterus appear normal.  Good news.

## 2016-05-21 NOTE — Telephone Encounter (Signed)
Melissa-- please advise u/s results?

## 2016-05-21 NOTE — Telephone Encounter (Signed)
Pt called in to check on her ultra sound results. Spoke with cma, she states that the results hasn't been reviewed by provider yet. Once they are reviewed she will call pt back to discuss.

## 2016-05-21 NOTE — Telephone Encounter (Signed)
Tiffany Hamilton-- can you check on status of urology referral?  Pt is having pain and is anxious. Thanks!  Notified pt. She states she continues to have kidney pain and is currently dealing with flu symptoms and feels bad over all. She is concerned about how long it will taker her to be seen by urology. She is anxious and in pain. Will ask referral co-ordinator to see if they can get her in as soon as possible.

## 2016-05-24 NOTE — Telephone Encounter (Signed)
Contacted [facility], awaiting appt status. They do have referral and will contact her today for appointment

## 2016-06-06 IMAGING — CR DG LUMBAR SPINE COMPLETE 4+V
5 series · 5 of 5 positions shown · non-contrast
Comparison: None in PACs

CLINICAL DATA: Chronic posterior neck pain with generalized upper
and lower back pain for the past 5 years without known injury

EXAM:
LUMBAR SPINE - COMPLETE 4+ VIEW

[t l-spine a.p.]
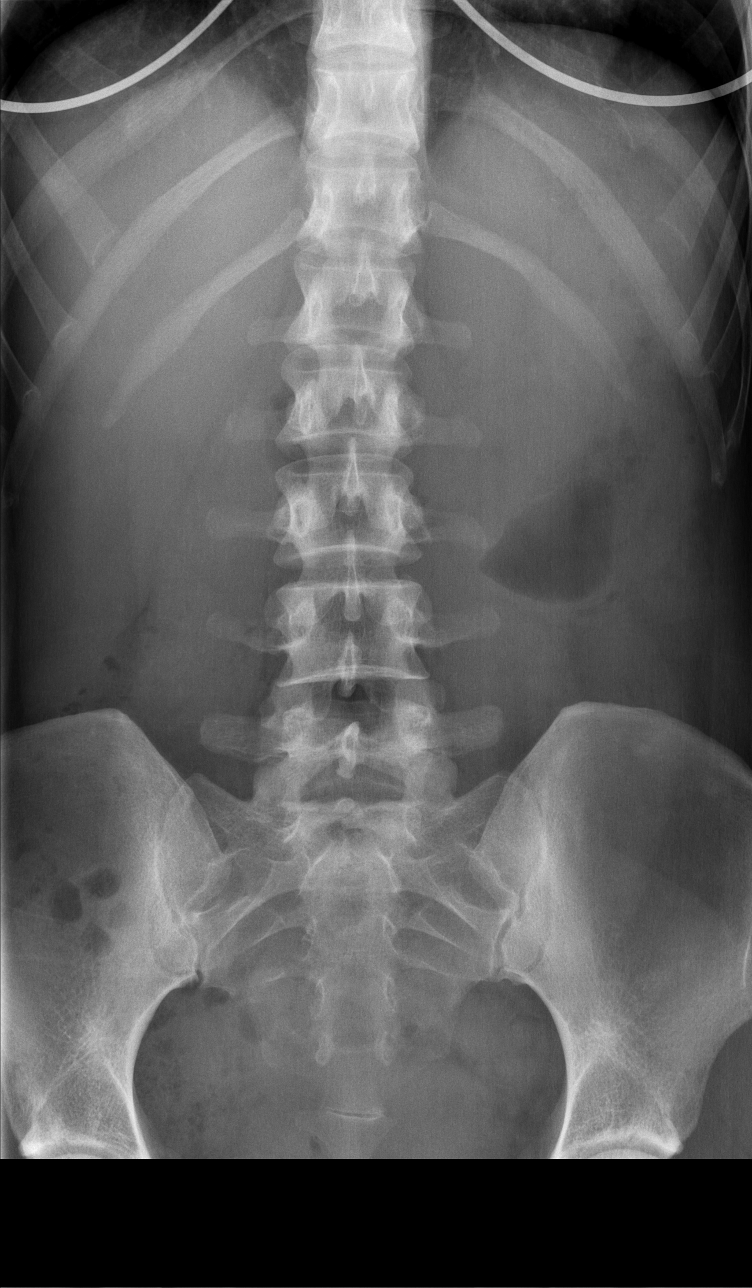

[t l-spine oblique exposure (1 of 2)]
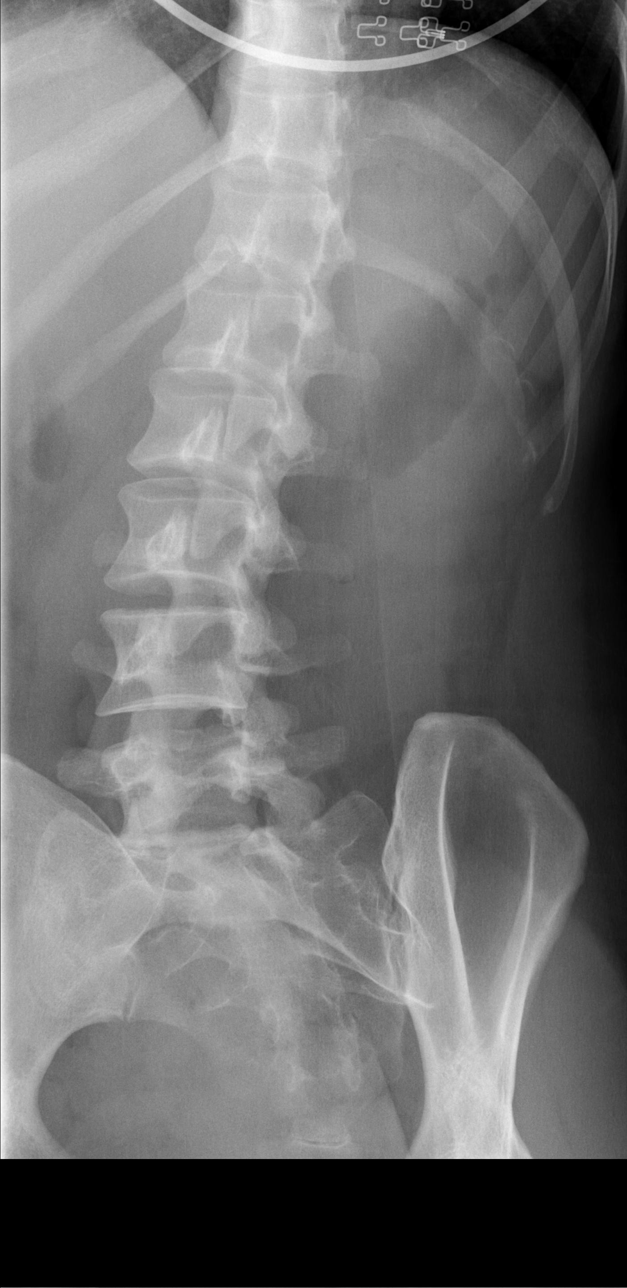

[t l-spine oblique exposure (2 of 2)]
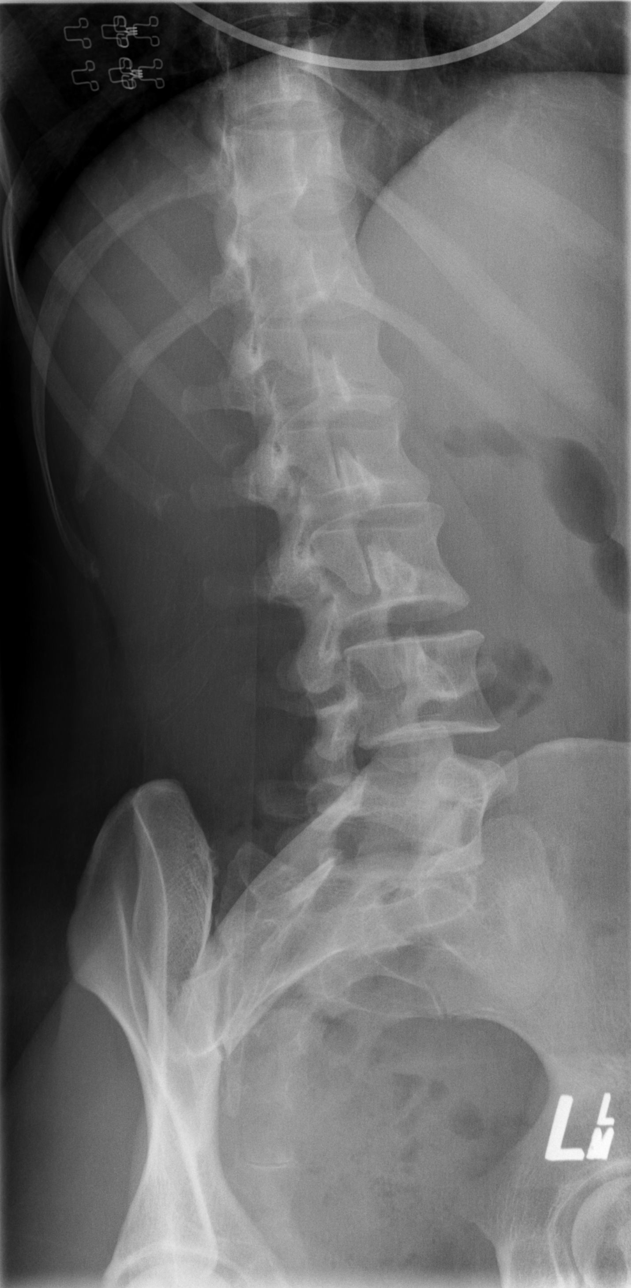

[t l-spine lat]
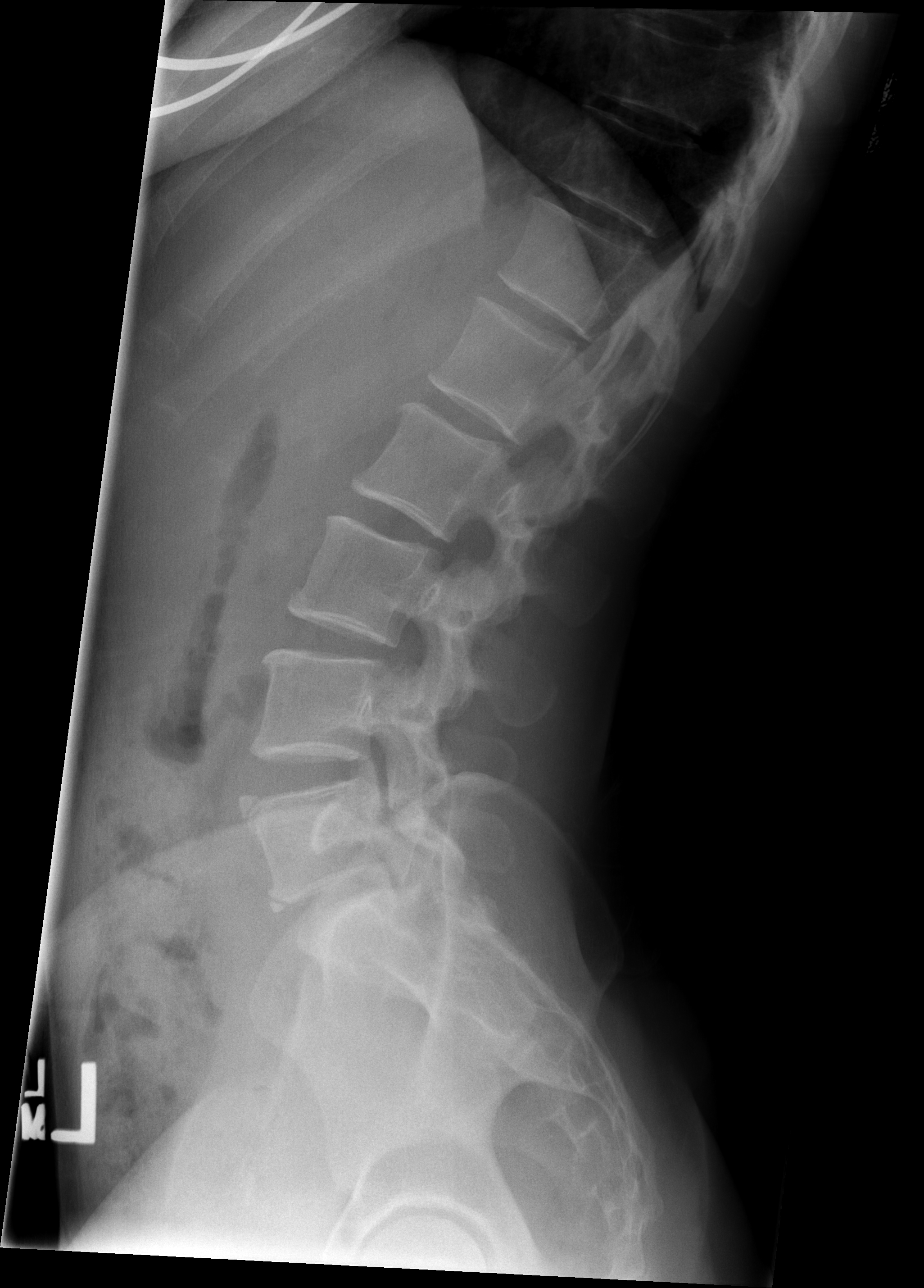

[t l-spine l5-s1 spot]
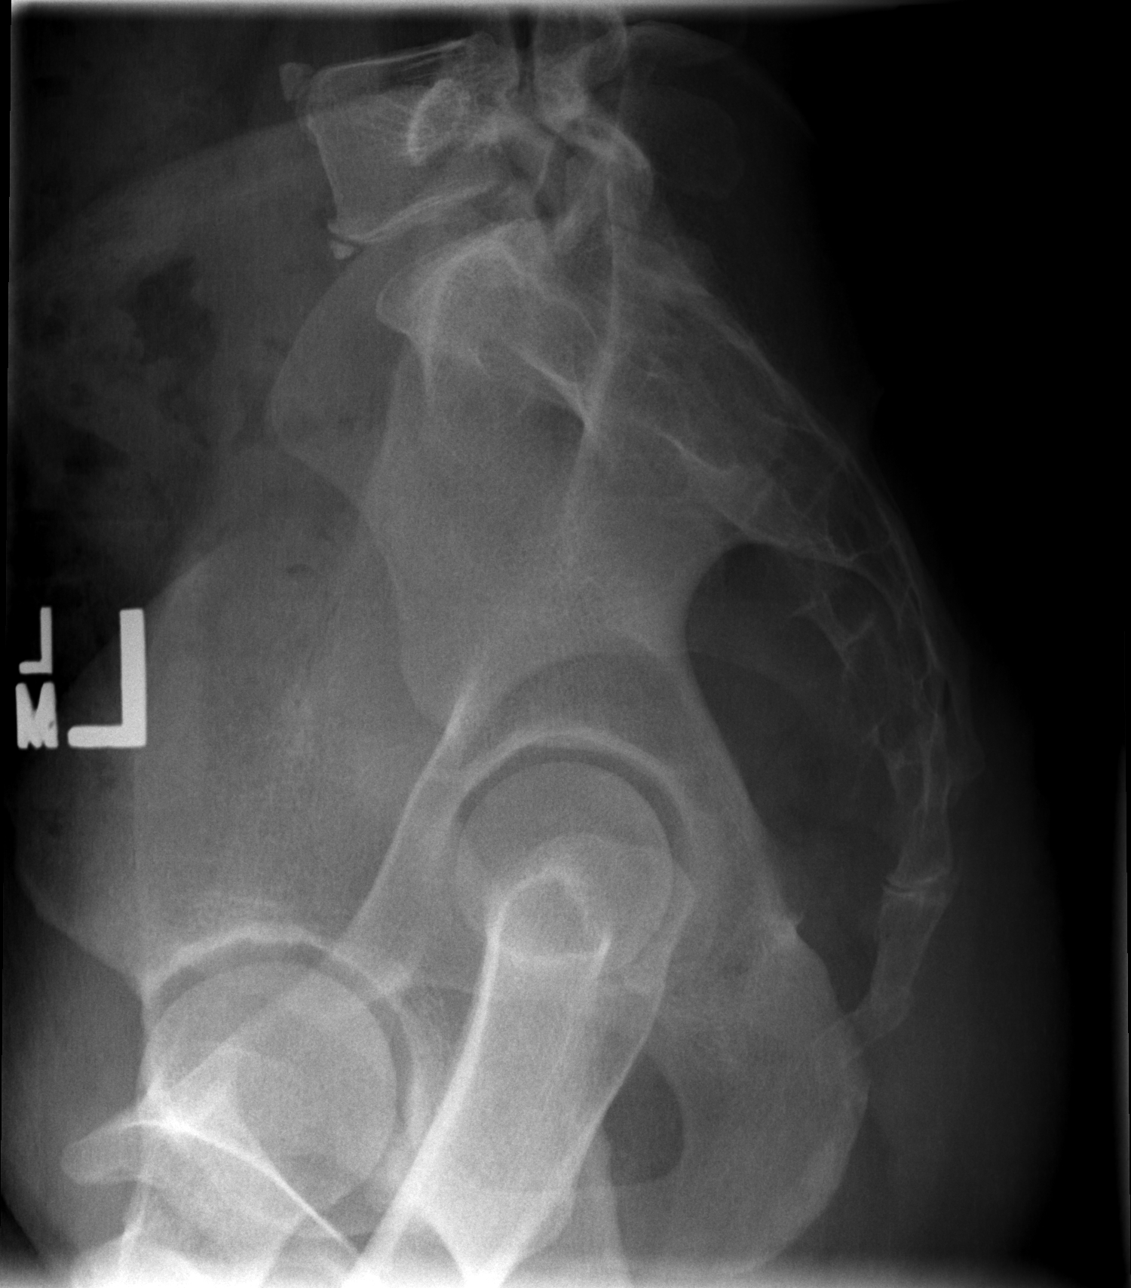

[5 of 5 positions shown; findings below may reference images not displayed]

FINDINGS: The lumbar vertebral bodies are preserved in height. The disc space
heights are well maintained. There is a limbus vertebra at L5. This
is a normal variant. There is no spondylolisthesis. The pedicles and
transverse processes are intact.
IMPRESSION: There is no acute or significant chronic bony abnormality of the
lumbar spine.

## 2016-06-10 ENCOUNTER — Other Ambulatory Visit: Payer: Self-pay | Admitting: Family

## 2016-06-30 ENCOUNTER — Encounter: Payer: Self-pay | Admitting: Family

## 2016-06-30 MED ORDER — FLUCONAZOLE 150 MG PO TABS: 150.0000 mg | ORAL_TABLET | Freq: Once | ORAL | 0 refills | Status: AC

## 2016-07-03 ENCOUNTER — Other Ambulatory Visit: Payer: Self-pay | Admitting: Family

## 2016-07-06 ENCOUNTER — Telehealth: Payer: Self-pay

## 2016-07-06 NOTE — Telephone Encounter (Signed)
Received telephone record from [facility] today. Called patient to follow up on call she made to [facility] on 07/03/16. Patient states  A refill was called in for Diflucan and she feels much better now.

## 2016-07-12 ENCOUNTER — Other Ambulatory Visit (HOSPITAL_COMMUNITY)
Admission: RE | Admit: 2016-07-12 | Discharge: 2016-07-12 | Disposition: A | Discharge status: Home / Self Care | Payer: 59 | Source: Ambulatory Visit | Attending: Family | Admitting: Family

## 2016-07-12 ENCOUNTER — Ambulatory Visit (INDEPENDENT_AMBULATORY_CARE_PROVIDER_SITE_OTHER): Payer: 59 | Admitting: Family

## 2016-07-12 ENCOUNTER — Encounter: Payer: Self-pay | Admitting: Family

## 2016-07-12 VITALS — BP 135/85 | HR 96 | Temp 98.5°F | Resp 16 | Ht 63.0 in | Wt 104.8 lb

## 2016-07-12 DIAGNOSIS — N760 Acute vaginitis: Secondary | ICD-10-CM | POA: Diagnosis not present

## 2016-07-12 NOTE — Patient Instructions (Signed)
We will contact you about your results and further recommendations.

## 2016-07-12 NOTE — Progress Notes (Signed)
Pre visit review using our clinic review tool, if applicable. No additional management support is needed unless otherwise documented below in the visit note. 

## 2016-07-12 NOTE — Progress Notes (Signed)
Subjective:    Patient ID: Tiffany Hamilton, female    DOB: 09/08/1980, 36 y.o.   MRN: [id number]  HPI  Tiffany Hamilton is a 36 yr old female who presents today to establish care.  She started monistat and took a dose of diflucan on 07/01/15, and then took monistat on 3/18.  Took second dose of diflucan on 3/17.  She reports that she feels fine today. Denies dysuria.    Review of Systems See HPI  Past Medical History:  Diagnosis Date  . Allergy   . Asthma   . Cyst of left kidney   . Eczema   . Hypothyroidism   . IBS (irritable bowel syndrome)      Social History   Social History  . Marital status: Married    Spouse name: N/A  . Number of children: 0  . Years of education: N/A   Occupational History  . home maker    Social History Main Topics  . Smoking status: Former Smoker    Years: 10.00    Types: Cigarettes    Quit date: 02/28/2012  . Smokeless tobacco: Never Used     Comment: Previous tobacco use was occasional for 10 years.  . Alcohol use 0.0 oz/week     Comment: twice a week has 2 drinks  . Drug use: No  . Sexual activity: Yes    Birth control/ protection: Pill   Other Topics Concern  . Not on file   Social History Narrative   Quit working 5/16    Bachelors degree   3 step children   1 dog   1 cat   Enjoys shopping, cleaning      Past Surgical History:  Procedure Laterality Date  . ROBOTICALLY ASSISTED LAPAROSCOPIC URETERAL RE-IMPLANTATION Right 2005    Family History  Problem Relation Age of Onset  . Hypertension Mother   . Hypothyroidism Mother   . Hypothyroidism Father   . Asthma Father   . Pancreatic cancer Maternal Uncle   . Throat cancer Maternal Grandfather   . Lung cancer Paternal Grandmother   . Throat cancer Paternal Grandfather   . Other Brother     Brain Tumor    Allergies  Allergen Reactions  . Morphine And Related Hives  . Penicillins Hives  . Septra [Sulfamethoxazole-Trimethoprim] Hives    Current Outpatient  Prescriptions on File Prior to Visit  Medication Sig Dispense Refill  . ADVAIR DISKUS 250-50 MCG/DOSE AEPB USE 1 INHALATION TWO TIMES  DAILY 180 each 1  . albuterol (PROVENTIL HFA;VENTOLIN HFA) 108 (90 BASE) MCG/ACT inhaler Inhale 2 puffs into the lungs every 6 (six) hours as needed for wheezing or shortness of breath. 1 Inhaler 2  . Crisaborole (EUCRISA) 2 % OINT Apply 1 application topically 2 (two) times daily as needed. 60 g 1  . Efinaconazole (JUBLIA) 10 % SOLN Apply topically. Apply once daily to affected toenail.    Marland Kitchen. levonorgestrel-ethinyl estradiol (JOLESSA) 0.15-0.03 MG tablet Take 1 tablet by mouth daily. 1 Package 3  . levothyroxine (SYNTHROID, LEVOTHROID) 112 MCG tablet TAKE 1 TABLET BY MOUTH  DAILY 90 tablet 1  . mometasone (NASONEX) 50 MCG/ACT nasal spray USE 2 SPRAYS NASALLY DAILY 51 g 1  . nitrofurantoin (MACRODANTIN) 50 MG capsule Take 1 capsule by mouth after intercourse.    . omeprazole (PRILOSEC) 20 MG capsule Take 1 capsule (20 mg total) by mouth daily. (Patient taking differently: Take 20 mg by mouth once a week. ) 90 capsule 3  .  SUMAtriptan (IMITREX) 50 MG tablet TAKE 1 TAB AT THE START OF MIGRAINE. MAY REPEAT 1 TAB IN 2 HOURS IF NEEDED (MAX 2 TABS/24 HOURS) 10 tablet 0   No current facility-administered medications on file prior to visit.     BP 135/85 (BP Location: Right Arm, Patient Position: Sitting, Cuff Size: Normal)   Pulse 96   Temp 98.5 F (36.9 C) (Oral)   Resp 16   Ht 5\' 3"  (1.6 m)   Wt 104 lb 12.8 oz (47.5 kg)   SpO2 100%   BMI 18.56 kg/m       Objective:   Physical Exam  Constitutional: She appears well-developed and well-nourished.  Cardiovascular: Normal rate, regular rhythm and normal heart sounds.   No murmur heard. Pulmonary/Chest: Effort normal and breath sounds normal. No respiratory distress. She has no wheezes.  Genitourinary:  Genitourinary Comments: Some white vaginal discharge. Pap performed.   Psychiatric: She has a normal mood  and affect. Her behavior is normal. Judgment and thought content normal.  Breasts: bilateral inverted nipples.  Normal breast exam, no masses or asymmetry noted.         Assessment & Plan:  Vaginitis- Pap smear obtained today. Will add ancillary testing for yeast, bacteria, trichomonas, HPV.  Further recommendations pending review of results.

## 2016-07-15 LAB — CYTOLOGY - PAP
BACTERIAL VAGINITIS: NEGATIVE
Candida vaginitis: NEGATIVE
Diagnosis: NEGATIVE
HPV (WINDOPATH): NOT DETECTED
Trichomonas: NEGATIVE

## 2016-07-20 ENCOUNTER — Other Ambulatory Visit: Payer: Self-pay | Admitting: Family

## 2016-07-21 NOTE — Telephone Encounter (Signed)
Faxed refill request received from [facility] for Levonorgestrel-ethinyl estradiol [90-day supply] Last filled by MD on [EMR states 08/14/15 #1 pkg with 3 refills] Last AEX - 07/12/16  Called and spoke with patient about needing to come in and give Urine Sample for POCT Pregnanacy test before we could approve refill; explained that we are showing according to her chart that her last refill was given in April of 2017,  patient was insistent that she gets her oral contraception filled every [3] months via [facility] and that they are waiting to expedite the order to her once received. Informed patient that I will send the order and apologized for any inconvenience, and also, that I would make PCP aware/SLS 04/04

## 2016-09-10 ENCOUNTER — Encounter: Payer: Self-pay | Admitting: Family

## 2016-09-10 MED ORDER — FLUCONAZOLE 150 MG PO TABS: ORAL_TABLET | ORAL | 0 refills | Status: DC

## 2016-12-02 ENCOUNTER — Encounter: Payer: Self-pay | Admitting: Medical

## 2016-12-02 ENCOUNTER — Telehealth: Payer: Self-pay

## 2016-12-02 ENCOUNTER — Ambulatory Visit (INDEPENDENT_AMBULATORY_CARE_PROVIDER_SITE_OTHER): Payer: 59 | Admitting: Medical

## 2016-12-02 VITALS — BP 134/85 | HR 77 | Temp 98.4°F | Resp 16 | Ht 63.0 in | Wt 106.8 lb

## 2016-12-02 DIAGNOSIS — S61052A Open bite of left thumb without damage to nail, initial encounter: Secondary | ICD-10-CM

## 2016-12-02 DIAGNOSIS — W540XXA Bitten by dog, initial encounter: Secondary | ICD-10-CM | POA: Diagnosis not present

## 2016-12-02 MED ORDER — DOXYCYCLINE HYCLATE 100 MG PO TABS: 100.0000 mg | ORAL_TABLET | Freq: Two times a day (BID) | ORAL | 0 refills | Status: DC

## 2016-12-02 NOTE — Telephone Encounter (Signed)
ES-I have faxed demographic information and DOI to Tammy @ [facility] at [phone]/her phone is [phone]/this is just an FYI per our discussion about the dog bite and Tammy said that she will follow-up with the patient/thx dmf,rma

## 2016-12-02 NOTE — Progress Notes (Signed)
   Subjective:    Patient ID: Tiffany SchoolsJamie Hamilton, female    DOB: November 02, 1980, 36 y.o.   MRN: [id number]  HPI  Pt in for recent small nic/bit today to both thumbs. Left base of thumb slight small bleed at time of accident(describe very minimal bite into epidermis). Happened one hour ago.Pt thinks rt thumb got bit but bit also.  Pt cleaned left thumb with peroxide  Pt is neighbors dog. Pt states dog maybe newly adopted stray.Pt not sure. She does not know if dog has had vaccines.  Review of Systems  Constitutional: Negative for chills, fatigue and fever.  Respiratory: Negative for cough, chest tightness, shortness of breath and wheezing.   Cardiovascular: Negative for chest pain and palpitations.  Skin:       Both thumb bites from dog per pt.   LMP- pt on ocp seasonal.    Objective:   Physical Exam  General-no acute distress.  Lt hand- no obvious bite or puncture. But at base of the faint pink color. Rt hand- no redness. No obvious bite. Neither hand has redness, swelling or warmth.       Assessment & Plan:  For recent dog bite both thumbs but worse left thumb, I am prescribing doxycycline to prevent infection. Good option since allergic to pcn.(note I considered bactrim DS as option but pt allergic to as well). Tried to find documentation has been on cephalasporin but could not find.Up to date reviewed for antibiotic options.  If either thumb has worsening infection appearance then notify us.  I am asking our staff to call animal control and then will fill out form and fax to their office.(form not found so asked 2 staff members to call them back so we can fill out form and fax)  But also would encourage you to call [phone].(having them come out and assess dog and review immunization status would be safest approach.  Follow up with us as needed  Make sure take ocp daily. Antibiotic choice explained and rx advisement given.  Ottis Sarnowski, Ramon DredgeEdward, PA-C

## 2016-12-02 NOTE — Patient Instructions (Addendum)
For recent dog bite both thumbs but worse left thumb, I am prescribing doxycycline to prevent infection. Good option since allergy to pcn.  If either thumb has worsening infection appearance then notify us.  I am asking our staff to call animal control and then will fill out form and fax to their office.  But also would encourage you to call [phone].(having them come out and assess dog and review immunization status would be safest approach.  Follow up with us as needed

## 2016-12-16 ENCOUNTER — Other Ambulatory Visit: Payer: Self-pay | Admitting: Family

## 2016-12-23 ENCOUNTER — Other Ambulatory Visit: Payer: Self-pay | Admitting: Family

## 2017-01-07 ENCOUNTER — Other Ambulatory Visit: Payer: Self-pay | Admitting: Family

## 2017-01-24 ENCOUNTER — Encounter: Payer: Self-pay | Admitting: Family

## 2017-01-24 DIAGNOSIS — E039 Hypothyroidism, unspecified: Secondary | ICD-10-CM

## 2017-01-25 NOTE — Telephone Encounter (Signed)
Please place TSH for [place].  Dx hypothyroid. Can you please see if we need to book a lab visit for that office?

## 2017-01-26 ENCOUNTER — Other Ambulatory Visit (INDEPENDENT_AMBULATORY_CARE_PROVIDER_SITE_OTHER): Payer: 59

## 2017-01-26 ENCOUNTER — Ambulatory Visit: Payer: 59

## 2017-01-26 DIAGNOSIS — Z23 Encounter for immunization: Secondary | ICD-10-CM

## 2017-01-26 DIAGNOSIS — E039 Hypothyroidism, unspecified: Secondary | ICD-10-CM | POA: Diagnosis not present

## 2017-01-26 LAB — TSH: TSH: 1.63 u[IU]/mL (ref 0.35–4.50)

## 2017-02-18 ENCOUNTER — Ambulatory Visit: Payer: 59

## 2017-03-03 ENCOUNTER — Other Ambulatory Visit: Payer: Self-pay | Admitting: Family

## 2017-03-04 NOTE — Telephone Encounter (Signed)
1/18 follow up please.

## 2017-03-04 NOTE — Telephone Encounter (Signed)
Advair refill sent to pharmacy. Pt last seen by PCP for acute illness in March and by PA in August for acute condition. Please advise when pt should return for routine follow up?

## 2017-03-11 ENCOUNTER — Other Ambulatory Visit: Payer: Self-pay | Admitting: Family

## 2017-05-02 ENCOUNTER — Encounter: Payer: 59 | Admitting: Family

## 2017-05-13 ENCOUNTER — Encounter: Payer: 59 | Admitting: Family

## 2017-05-16 ENCOUNTER — Other Ambulatory Visit: Payer: Self-pay | Admitting: Family

## 2017-05-16 MED ORDER — LEVOTHYROXINE SODIUM 112 MCG PO TABS: 112.0000 ug | ORAL_TABLET | Freq: Every day | ORAL | 0 refills | Status: DC

## 2017-06-01 ENCOUNTER — Ambulatory Visit (INDEPENDENT_AMBULATORY_CARE_PROVIDER_SITE_OTHER): Payer: 59 | Admitting: Family

## 2017-06-01 ENCOUNTER — Encounter: Payer: Self-pay | Admitting: Family

## 2017-06-01 ENCOUNTER — Other Ambulatory Visit: Payer: Self-pay | Admitting: Family

## 2017-06-01 VITALS — BP 126/68 | HR 68 | Resp 16 | Ht 63.0 in | Wt 107.6 lb

## 2017-06-01 DIAGNOSIS — J45909 Unspecified asthma, uncomplicated: Secondary | ICD-10-CM | POA: Diagnosis not present

## 2017-06-01 DIAGNOSIS — E039 Hypothyroidism, unspecified: Secondary | ICD-10-CM

## 2017-06-01 DIAGNOSIS — Z0000 Encounter for general adult medical examination without abnormal findings: Secondary | ICD-10-CM | POA: Diagnosis not present

## 2017-06-01 DIAGNOSIS — R7989 Other specified abnormal findings of blood chemistry: Secondary | ICD-10-CM

## 2017-06-01 DIAGNOSIS — R945 Abnormal results of liver function studies: Secondary | ICD-10-CM

## 2017-06-01 LAB — URINALYSIS, ROUTINE W REFLEX MICROSCOPIC
BILIRUBIN URINE: NEGATIVE
KETONES UR: NEGATIVE
LEUKOCYTES UA: NEGATIVE
Nitrite: NEGATIVE
RBC / HPF: NONE SEEN (ref 0–?)
Specific Gravity, Urine: <=1.005 — AB (ref 1.000–1.030)
Total Protein, Urine: NEGATIVE
UROBILINOGEN UA: 0.2 (ref 0.0–1.0)
Urine Glucose: NEGATIVE
pH: 6 (ref 5.0–8.0)

## 2017-06-01 LAB — CBC WITH DIFFERENTIAL/PLATELET
BASOS ABS: 0 10*3/uL (ref 0.0–0.1)
Basophils Relative: 0.7 % (ref 0.0–3.0)
EOS ABS: 0.2 10*3/uL (ref 0.0–0.7)
Eosinophils Relative: 3.4 % (ref 0.0–5.0)
HEMATOCRIT: 44.9 % (ref 36.0–46.0)
Hemoglobin: 15.1 g/dL — ABNORMAL HIGH (ref 12.0–15.0)
LYMPHS PCT: 29.8 % (ref 12.0–46.0)
Lymphs Abs: 1.7 10*3/uL (ref 0.7–4.0)
MCHC: 33.7 g/dL (ref 30.0–36.0)
MCV: 92.9 fl (ref 78.0–100.0)
MONOS PCT: 9.5 % (ref 3.0–12.0)
Monocytes Absolute: 0.5 10*3/uL (ref 0.1–1.0)
NEUTROS ABS: 3.3 10*3/uL (ref 1.4–7.7)
NEUTROS PCT: 56.6 % (ref 43.0–77.0)
PLATELETS: 253 10*3/uL (ref 150.0–400.0)
RBC: 4.83 Mil/uL (ref 3.87–5.11)
RDW: 12.5 % (ref 11.5–15.5)
WBC: 5.8 10*3/uL (ref 4.0–10.5)

## 2017-06-01 LAB — BASIC METABOLIC PANEL
BUN: 11 mg/dL (ref 6–23)
CALCIUM: 9.9 mg/dL (ref 8.4–10.5)
CHLORIDE: 103 meq/L (ref 96–112)
CO2: 27 meq/L (ref 19–32)
CREATININE: 0.83 mg/dL (ref 0.40–1.20)
GFR: 82.8 mL/min (ref >60.00)
GLUCOSE: 80 mg/dL (ref 70–99)
Potassium: 4.7 mEq/L (ref 3.5–5.1)
Sodium: 138 mEq/L (ref 135–145)

## 2017-06-01 LAB — HEPATIC FUNCTION PANEL
ALBUMIN: 4.4 g/dL (ref 3.5–5.2)
ALT: 66 U/L — ABNORMAL HIGH (ref 0–35)
AST: 30 U/L (ref 0–37)
Alkaline Phosphatase: 26 U/L — ABNORMAL LOW (ref 39–117)
BILIRUBIN DIRECT: 0.2 mg/dL (ref 0.0–0.3)
TOTAL PROTEIN: 7.7 g/dL (ref 6.0–8.3)
Total Bilirubin: 1.2 mg/dL (ref 0.2–1.2)

## 2017-06-01 LAB — LIPID PANEL
CHOL/HDL RATIO: 2
Cholesterol: 183 mg/dL (ref 0–200)
HDL: 91.3 mg/dL (ref >39.00)
LDL Cholesterol: 80 mg/dL (ref 0–99)
NonHDL: 91.4
TRIGLYCERIDES: 59 mg/dL (ref 0.0–149.0)
VLDL: 11.8 mg/dL (ref 0.0–40.0)

## 2017-06-01 LAB — TSH: TSH: 0.49 u[IU]/mL (ref 0.35–4.50)

## 2017-06-01 MED ORDER — NITROFURANTOIN MACROCRYSTAL 50 MG PO CAPS: ORAL_CAPSULE | ORAL | 1 refills | Status: AC

## 2017-06-01 MED ORDER — MOMETASONE FUROATE 50 MCG/ACT NA SUSP
NASAL | 1 refills | Status: AC
Start: 2017-06-01 — End: ?

## 2017-06-01 MED ORDER — FLUTICASONE-SALMETEROL 250-50 MCG/DOSE IN AEPB: INHALATION_SPRAY | RESPIRATORY_TRACT | 1 refills | Status: AC

## 2017-06-01 NOTE — Progress Notes (Signed)
Subjective:    Patient ID: Tiffany Hamilton, female    DOB: 11-09-1980, 37 y.o.   MRN: [id number]  HPI  Patient presents today for complete physical.  Immunizations:  Tetanus and flu shot up to date Diet: healthy diet Exercise:  3 times a week, elliptical, weight machines Wt Readings from Last 3 Encounters:  06/01/17 107 lb 9.6 oz (48.8 kg)  12/02/16 106 lb 12.8 oz (48.4 kg)  07/12/16 104 lb 12.8 oz (47.5 kg)  Pap Smear: 3/18- normal Mammogram: 07/07/17   Asthma- reports that breathing is stable on one puff bid of advair.  Hypothyroid-reports that she feels stable on current dose of synthroid.    Review of Systems  Constitutional: Negative for unexpected weight change.  HENT: Negative for hearing loss and rhinorrhea.   Eyes: Negative for visual disturbance.  Respiratory: Negative for cough.   Cardiovascular: Negative for leg swelling.  Gastrointestinal: Positive for diarrhea (occasional diarrhea from ibs). Negative for constipation.  Genitourinary: Negative for dysuria and frequency.  Musculoskeletal: Negative for arthralgias and myalgias.  Skin:       Has occasional eczema  Neurological: Negative for headaches.  Hematological: Negative for adenopathy.  Psychiatric/Behavioral:       Denies depression/anxiety   Past Medical History:  Diagnosis Date  . Allergy   . Asthma   . Cyst of left kidney   . Eczema   . Hypothyroidism   . IBS (irritable bowel syndrome)      Social History   Socioeconomic History  . Marital status: Married    Spouse name: Not on file  . Number of children: 0  . Years of education: Not on file  . Highest education level: Not on file  Social Needs  . Financial resource strain: Not on file  . Food insecurity - worry: Not on file  . Food insecurity - inability: Not on file  . Transportation needs - medical: Not on file  . Transportation needs - non-medical: Not on file  Occupational History  . Occupation: home maker  Tobacco Use  .  Smoking status: Former Smoker    Years: 10.00    Types: Cigarettes    Last attempt to quit: 02/28/2012    Years since quitting: 5.2  . Smokeless tobacco: Never Used  . Tobacco comment: Previous tobacco use was occasional for 10 years.  Substance and Sexual Activity  . Alcohol use: Yes    Alcohol/week: 0.0 oz    Comment: twice a week has 2 drinks  . Drug use: No  . Sexual activity: Yes    Birth control/protection: Pill  Other Topics Concern  . Not on file  Social History Narrative   Quit working 5/16    Bachelors degree   3 step children   1 dog   1 cat   Enjoys shopping, cleaning      Past Surgical History:  Procedure Laterality Date  . ROBOTICALLY ASSISTED LAPAROSCOPIC URETERAL RE-IMPLANTATION Right 2005    Family History  Problem Relation Age of Onset  . Hypertension Mother   . Hypothyroidism Mother   . Hypothyroidism Father   . Asthma Father   . Pancreatic cancer Maternal Uncle   . Throat cancer Maternal Grandfather   . Lung cancer Paternal Grandmother   . Throat cancer Paternal Grandfather   . Other Brother        Brain Tumor    Allergies  Allergen Reactions  . Morphine And Related Hives  . Penicillins Hives  . Septra [Sulfamethoxazole-Trimethoprim] Hives  Current Outpatient Medications on File Prior to Visit  Medication Sig Dispense Refill  . ADVAIR DISKUS 250-50 MCG/DOSE AEPB USE 1 INHALATION TWO TIMES  DAILY 180 each 1  . albuterol (PROVENTIL HFA;VENTOLIN HFA) 108 (90 BASE) MCG/ACT inhaler Inhale 2 puffs into the lungs every 6 (six) hours as needed for wheezing or shortness of breath. 1 Inhaler 2  . Crisaborole (EUCRISA) 2 % OINT Apply 1 application topically 2 (two) times daily as needed. 60 g 1  . fluconazole (DIFLUCAN) 150 MG tablet 1 tab by mouth today, may repeat in 2 days as needed for yeast infection 2 tablet 0  . levothyroxine (SYNTHROID, LEVOTHROID) 112 MCG tablet Take 1 tablet (112 mcg total) by mouth daily. 30 tablet 0  . mometasone  (NASONEX) 50 MCG/ACT nasal spray USE 2 SPRAYS NASALLY DAILY 51 g 1  . nitrofurantoin (MACRODANTIN) 50 MG capsule TAKE 1 CAPSULE BY MOUTH  AFTER INTERCOUSE 60 capsule 1   No current facility-administered medications on file prior to visit.     BP 126/68 (BP Location: Right Arm, Patient Position: Sitting, Cuff Size: Normal)   Pulse 68   Resp 16   Ht 5\' 3"  (1.6 m)   Wt 107 lb 9.6 oz (48.8 kg)   SpO2 98%   BMI 19.06 kg/m       Objective:   Physical Exam  Physical Exam  Constitutional: She is oriented to person, place, and time. She appears well-developed and well-nourished. No distress.  HENT:  Head: Normocephalic and atraumatic.  Right Ear: Tympanic membrane and ear canal normal.  Left Ear: Tympanic membrane and ear canal normal.  Mouth/Throat: Oropharynx is clear and moist.  Eyes: Pupils are equal, round, and reactive to light. No scleral icterus.  Neck: Normal range of motion. No thyromegaly present.  Cardiovascular: Normal rate and regular rhythm.   No murmur heard. Pulmonary/Chest: Effort normal and breath sounds normal. No respiratory distress. He has no wheezes. She has no rales. She exhibits no tenderness.  Abdominal: Soft. Bowel sounds are normal. She exhibits no distension and no mass. There is no tenderness. There is no rebound and no guarding.  Musculoskeletal: She exhibits no edema.  Lymphadenopathy:    She has no cervical adenopathy.  Neurological: She is alert and oriented to person, place, and time. She has normal patellar reflexes. She exhibits normal muscle tone. Coordination normal.  Skin: Skin is warm and dry.  Psychiatric: She has a normal mood and affect. Her behavior is normal. Judgment and thought content normal.  Breasts: Examined lying Right: Without masses, retractions, discharge or axillary adenopathy.  Left: Without masses, retractions, discharge or axillary adenopathy.  Pelvic: deferred     Assessment & Plan:          Assessment & Plan:   Preventative care- pap/immunizations reviewed and up to date. Will obtain routine lab work. Discussed BSE.  Hypothyroid- obtain follow up tsh. Continue synthroid.  Asthma- stable on current dose of advair/albuterol. Continue same.

## 2017-06-01 NOTE — Telephone Encounter (Signed)
Please let pt know that one of her liver tests is elevated.  I would like to repeat LFT in 2 weeks. Also please ask lab to add on ferritin level to current labs.  Dx abnormal lft.  How often does she drink alcohol and if if so how much?    I would like to decrease her synthroid from 112 mcg to . Repeat tsh in 6 weeks.

## 2017-06-03 MED ORDER — LEVOTHYROXINE SODIUM 100 MCG PO TABS: 100.0000 ug | ORAL_TABLET | Freq: Every day | ORAL | 1 refills | Status: DC

## 2017-06-03 NOTE — Telephone Encounter (Signed)
Notified pt. Pt states she drinks no more than 1 glass of wine a night.   Pt requests to schedule lab appts at [facility] [place] office for convenience purposes. Lab appts scheduled for 06/17/17 and 07/15/17.

## 2017-06-13 ENCOUNTER — Other Ambulatory Visit: Payer: Self-pay | Admitting: Family

## 2017-06-17 ENCOUNTER — Other Ambulatory Visit: Payer: 59

## 2017-07-15 ENCOUNTER — Other Ambulatory Visit (INDEPENDENT_AMBULATORY_CARE_PROVIDER_SITE_OTHER): Payer: 59

## 2017-07-15 DIAGNOSIS — E039 Hypothyroidism, unspecified: Secondary | ICD-10-CM | POA: Diagnosis not present

## 2017-07-15 DIAGNOSIS — R945 Abnormal results of liver function studies: Secondary | ICD-10-CM | POA: Diagnosis not present

## 2017-07-15 DIAGNOSIS — R7989 Other specified abnormal findings of blood chemistry: Secondary | ICD-10-CM

## 2017-07-15 LAB — HEPATIC FUNCTION PANEL
ALT: 96 U/L — ABNORMAL HIGH (ref 0–35)
AST: 120 U/L — ABNORMAL HIGH (ref 0–37)
Albumin: 4 g/dL (ref 3.5–5.2)
Alkaline Phosphatase: 26 U/L — ABNORMAL LOW (ref 39–117)
Bilirubin, Direct: 0.2 mg/dL (ref 0.0–0.3)
Total Bilirubin: 1 mg/dL (ref 0.2–1.2)
Total Protein: 6.9 g/dL (ref 6.0–8.3)

## 2017-07-15 LAB — TSH: TSH: 1.6 u[IU]/mL (ref 0.35–4.50)

## 2017-07-15 LAB — FERRITIN: Ferritin: 33.9 ng/mL (ref 10.0–291.0)

## 2017-07-18 ENCOUNTER — Encounter: Payer: Self-pay | Admitting: Family

## 2017-07-18 ENCOUNTER — Telehealth: Payer: Self-pay | Admitting: Family

## 2017-07-18 DIAGNOSIS — R945 Abnormal results of liver function studies: Secondary | ICD-10-CM

## 2017-07-18 DIAGNOSIS — R7989 Other specified abnormal findings of blood chemistry: Secondary | ICD-10-CM

## 2017-07-18 NOTE — Telephone Encounter (Signed)
Notified pt and she voices understanding. Pt states she had to cancel u/s at [facility] due to cost. [facility] much [facility] on [address] and she requests order be sent there. Order cancelled and re-sent.

## 2017-07-18 NOTE — Telephone Encounter (Signed)
Please contact pt and let her know that her liver function testing is elevated. I would like her to complete an abdominal US to check her gallbladder for gallstones.

## 2017-07-18 NOTE — Telephone Encounter (Signed)
Please see telephone encounter from 07/18/17.

## 2017-07-19 ENCOUNTER — Ambulatory Visit (HOSPITAL_BASED_OUTPATIENT_CLINIC_OR_DEPARTMENT_OTHER): Payer: 59

## 2017-07-27 ENCOUNTER — Ambulatory Visit
Admission: RE | Admit: 2017-07-27 | Discharge: 2017-07-27 | Disposition: A | Discharge status: Home / Self Care | Payer: 59 | Source: Ambulatory Visit | Attending: Family | Admitting: Family

## 2017-07-27 ENCOUNTER — Telehealth: Payer: Self-pay | Admitting: Family

## 2017-07-27 DIAGNOSIS — R7989 Other specified abnormal findings of blood chemistry: Secondary | ICD-10-CM

## 2017-07-27 DIAGNOSIS — R945 Abnormal results of liver function studies: Secondary | ICD-10-CM

## 2017-07-27 NOTE — Telephone Encounter (Signed)
Please contact patient and let her know that her gallbladder looks normal on ultrasound.  Only possible abnormality noted was that she may have very mild fatty liver.  What is her alcohol intake light?  I would recommend that she abstain from alcohol.  Return to lab in 1 week for lab tests as ordered.  If her liver function tests are still abnormal on her follow-up I will plan referral to gastroenterology.

## 2017-07-28 ENCOUNTER — Encounter: Payer: Self-pay | Admitting: Family

## 2017-07-28 ENCOUNTER — Telehealth: Payer: Self-pay | Admitting: Family

## 2017-07-28 ENCOUNTER — Other Ambulatory Visit (INDEPENDENT_AMBULATORY_CARE_PROVIDER_SITE_OTHER): Payer: 59

## 2017-07-28 DIAGNOSIS — R945 Abnormal results of liver function studies: Secondary | ICD-10-CM | POA: Diagnosis not present

## 2017-07-28 DIAGNOSIS — R7989 Other specified abnormal findings of blood chemistry: Secondary | ICD-10-CM

## 2017-07-28 LAB — HEPATIC FUNCTION PANEL
ALT: 37 U/L — ABNORMAL HIGH (ref 0–35)
AST: 22 U/L (ref 0–37)
Albumin: 4.1 g/dL (ref 3.5–5.2)
Alkaline Phosphatase: 27 U/L — ABNORMAL LOW (ref 39–117)
Bilirubin, Direct: 0.2 mg/dL (ref 0.0–0.3)
Total Bilirubin: 1.1 mg/dL (ref 0.2–1.2)
Total Protein: 6.8 g/dL (ref 6.0–8.3)

## 2017-07-28 LAB — FERRITIN: Ferritin: 39.1 ng/mL (ref 10.0–291.0)

## 2017-07-28 NOTE — Telephone Encounter (Signed)
Patient advised of results, she reports she drinks about 1-2 drinks per night. I advised her to abstain from alcohol. She came in today for her labs.

## 2017-07-28 NOTE — Telephone Encounter (Signed)
Copied from CRM [phone]. Topic: Inquiry >> Jul 18, 2017 12:53 PM Alexander BergeronBarksdale, Harvey B wrote: Reason for CRM: pt called about her abdominal ultrasound and wants to speak about it, call pt to advise  >> Jul 28, 2017  8:41 AM Arlyss Gandyichardson, Nycole Kawahara N, NT wrote: Pt would like to get her results from her US that she had completed yesterday. Also would like to know if she can have the lab work done today at Dr. Rennis [place]abori[place].

## 2017-07-28 NOTE — Telephone Encounter (Signed)
FYI

## 2017-07-28 NOTE — Telephone Encounter (Signed)
See my chart message

## 2017-07-29 LAB — HEPATITIS PANEL, ACUTE
HEP A IGM: NONREACTIVE
Hep B C IgM: NONREACTIVE
Hepatitis B Surface Ag: NONREACTIVE
Hepatitis C Ab: NONREACTIVE
SIGNAL TO CUT-OFF: 0.01 (ref <1.00)

## 2017-07-30 ENCOUNTER — Other Ambulatory Visit: Payer: Self-pay | Admitting: Family

## 2017-08-12 ENCOUNTER — Encounter: Payer: Self-pay | Admitting: Family

## 2017-08-15 MED ORDER — LEVOTHYROXINE SODIUM 100 MCG PO TABS: 100.0000 ug | ORAL_TABLET | Freq: Every day | ORAL | 1 refills | Status: DC

## 2017-08-23 ENCOUNTER — Other Ambulatory Visit (INDEPENDENT_AMBULATORY_CARE_PROVIDER_SITE_OTHER): Payer: 59

## 2017-08-23 DIAGNOSIS — R7989 Other specified abnormal findings of blood chemistry: Secondary | ICD-10-CM

## 2017-08-23 DIAGNOSIS — R945 Abnormal results of liver function studies: Secondary | ICD-10-CM

## 2017-08-23 LAB — HEPATIC FUNCTION PANEL
ALK PHOS: 28 U/L — AB (ref 39–117)
ALT: 34 U/L (ref 0–35)
AST: 22 U/L (ref 0–37)
Albumin: 4.5 g/dL (ref 3.5–5.2)
BILIRUBIN TOTAL: 1.1 mg/dL (ref 0.2–1.2)
Bilirubin, Direct: 0.2 mg/dL (ref 0.0–0.3)
Total Protein: 7.4 g/dL (ref 6.0–8.3)

## 2017-08-23 NOTE — Telephone Encounter (Signed)
Viewed chart and see that pt had labs completed this morning at [place] office.

## 2017-10-13 ENCOUNTER — Telehealth: Payer: Self-pay

## 2017-10-13 NOTE — Telephone Encounter (Signed)
Copied from CRM [id number]. Topic: General - Other >> Oct 13, 2017 10:42 AM Oneal GroutSebastian, Jennifer S wrote: Reason for CRM: Would like Sandford CrazeMelissa O'Sullivan to know that [place] out of [place]

## 2017-10-27 ENCOUNTER — Encounter: Payer: Self-pay | Admitting: Family

## 2017-10-27 MED ORDER — CRISABOROLE 2 % EX OINT: 1.0000 "application " | TOPICAL_OINTMENT | Freq: Two times a day (BID) | CUTANEOUS | 1 refills | Status: DC | PRN

## 2017-10-27 MED ORDER — CRISABOROLE 2 % EX OINT: 1.0000 "application " | TOPICAL_OINTMENT | Freq: Two times a day (BID) | CUTANEOUS | 1 refills | Status: AC | PRN

## 2017-10-31 ENCOUNTER — Telehealth: Payer: Self-pay

## 2017-10-31 NOTE — Telephone Encounter (Signed)
PA approved through 11/01/2018.

## 2017-10-31 NOTE — Telephone Encounter (Signed)
PA initiated via Covermymeds; KEY: [id number]. Awaiting determination.

## 2017-11-01 ENCOUNTER — Telehealth: Payer: Self-pay | Admitting: *Deleted

## 2017-11-01 NOTE — Telephone Encounter (Signed)
[facility] from [facility] requesting clarification of day supply for insurance purposes. Will call pharmacy when they open for further clarification since directions state to apply twice a day.

## 2017-11-02 NOTE — Telephone Encounter (Signed)
Spoke with pharmacist and she is unsure why we received the below question. Rx directions / quantity look ok to her. States they are waiting on a PA. Advised her that PA was approved on 10/31/17 and she was able to process Rx without any problem. Nothing further needed.

## 2017-12-30 ENCOUNTER — Other Ambulatory Visit: Payer: Self-pay | Admitting: Family
